# Patient Record
Sex: Male | Born: 1950 | Race: White | Hispanic: No | Marital: Single | Smoking: Never smoker
Health system: Southern US, Community
[De-identification: ages and names within clinical notes are randomized; demographics above are authoritative.]

## PROBLEM LIST (undated history)

## (undated) DIAGNOSIS — I10 Essential (primary) hypertension: Secondary | ICD-10-CM

## (undated) DIAGNOSIS — K219 Gastro-esophageal reflux disease without esophagitis: Secondary | ICD-10-CM

## (undated) DIAGNOSIS — A809 Acute poliomyelitis, unspecified: Secondary | ICD-10-CM

## (undated) HISTORY — PX: CHOLECYSTECTOMY: SHX55

---

## 2008-08-11 ENCOUNTER — Encounter
Admission: RE | Admit: 2008-08-11 | Discharge: 2008-09-15 | Payer: Self-pay | Admitting: Physical Medicine and Rehabilitation

## 2008-08-12 ENCOUNTER — Ambulatory Visit: Payer: Self-pay | Admitting: Physical Medicine and Rehabilitation

## 2008-09-09 ENCOUNTER — Ambulatory Visit: Payer: Self-pay | Admitting: Physical Medicine and Rehabilitation

## 2010-12-06 NOTE — Assessment & Plan Note (Signed)
[name] is a very pleasant 60 year old gentleman who has a  history of polio back in 1959.  He is a patient of Dr. Margo Common.   Christopher Aguilar was last seen August 12, 2008.  He was referred for  evaluation for sources of the left leg.   Christopher Aguilar has no history of any kind of pain or left knee problems.   He does have a history of 8-10-year slowly progressive weakening of his  quadriceps and hamstrings.   Christopher Aguilar continues to be a very high functioning gentleman.  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   His function is very high.  He states that the leg does not give way on  him not more than 1 time every few months.  It is not really limiting  him at all in his functional activities or his ambulation at this time.   At the last visit, there was consideration given to getting him an  orthosis to minimize left knee hyperextension during heel strike.  He  discussed this further with orthotist to provide him with the type of  orthosis which would require custom fit was 650 dollars.   Christopher Aguilar does not feel that he can justify spending that much money  on a brace at this time.  He understands that the brace is meant to help  prevent further stretching of the joint capsule over time; however, his  function is so good that he has difficulty justifying pain for a brace  that he is not sure he would use most of the time.   There have been no other changes in the past medical, social, or family  history since the last visit.   PHYSICAL EXAMINATION:  His blood pressure is 156/79, pulse 77,  respirations 18, 97% saturated on room air.  He is a well-developed,  well-nourished gentleman who does not appear in any distress.  He is  oriented x3.  Speech is clear.  Affect is bright.  He is alert,  cooperative, and pleasant.  Follows  commands without difficulty.  Cranial nerves are grossly intact.  Coordination is intact.  Reflexes  are diminished in the lower extremities.  Sensation is intact in the  lower extremities.   Motor strength in the left quadriceps is 1-2/5.  Left hamstrings 1-2/5,  hip flexors are 4+ to 5/5 on the left and 4+ to 5/5 at ankle  dorsiflexion and plantar flexion.  The right lower extremity is 5/5  throughout the right lower extremity.  He has very good muscle bulk in  the right lower extremity.  In the left lower extremity, he has  significant atrophy of the left quadriceps and hamstrings.   He does not appear to have any medial or lateral laxity at the left knee  joint.  He has a couple millimeters of laxity with anterior and  posterior translation with Lachman maneuver on the left.   Bilateral pes cava deformity noted in both feet.  Limb shortening in the  left lower extremity to 3 cm.   The radiographs of the lumbar spine was obtained; however, the hospital  has not sent [place] a copy of that report as of yet.  We will need to have  staff tracked the x-ray report down.   IMPRESSION:  1. History of  polio with residual left leg weakness.  2. History of very slowly progressive weakness in the left lower      extremity over 8-10 years consistent with post polio type syndrome.  3. 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  4. Bilateral pes cavus.  5. Lumbar scoliosis.  6. Gait abnormalities with knee hyperextension at heel strike      secondary to quadriceps weakness.   PLAN:  A long discussion regarding treatment options for Christopher Aguilar  today.  The custom brace which he has considered is approximately 650  dollars.  He is not sure he would wear the brace that much at this point  and would like to hold off prior to considering purchase.  We would like  him to follow up on a p.r.n. basis in our clinic when our orthotist and  physical therapist are present for further evaluation  when he feels he  is ready to consider orthotic management of his left knee weakness.  We  will see him back on a p.r.n. basis.  We will obtain the results of the  lumbar radiographs and reviewed that with him once the results are  known.           ______________________________  Brantley Stage, M.D.     DMK/MedQ  D:  09/09/2008 14:08:18  T:  09/10/2008 01:33:58  Job #:  [id number]   cc:   Wyvonnia Lora  Fax: [phone]

## 2010-12-06 NOTE — Group Therapy Note (Signed)
[name] is a 60 year old gentleman who has a history of polio  onset in 59.  He is kindly referred by Dr. Margo Common.   Christopher Aguilar states that when he was approximately 39 or 60 years old in  1958, he contracted polio and he was subsequently hospitalized and  underwent some physical therapy and some hydrotherapy for strengthening  his left lower limb.   Christopher Aguilar states that he has had left limb weakness all his life.  However, he has noted over the last several years an increase in the  weakness in this left lower extremity.   He states over the last several years, he has noted that the left leg  seems to get more tired.  He has difficulty now walking up hills.  He  has noted that when he is standing and doing work, if he accidentally  puts more weight through the left leg, he may fall.   For the most part, this has been very slowly expressive over several  years' time.   He reports he does have some occasional low back achiness.  He denies  any type of radicular pain, pain shooting down the leg.  He has no  particular joint pain in the hip, or the knee, or the ankle.   Denies any numbness, tingling, or tremors.   He reports overall he is an extremely active gentleman.  He is able to  be up walking all day.  He can climb stairs.  He does have some  difficulty climbing with the left leg.  He may have to pull himself up  at sometimes on occasion.  He is retired as a Cytogeneticist.  However, he is taking part-time job with [employer]ing [employer].   He is independent with all of his self-care and independent, and does  not use any assistive devices for ambulation at this time.  He does have  a knee sleeve that he occasionally wears, but he does not use that  frequently.   REVIEW OF SYSTEMS:  Essentially negative.   Physicians involved in his chronic care include Dr. Margo Common.   PAST MEDICAL HISTORY:  Remarkable for hypertension only.  He denies past  surgical  interventions.   He is married.  He does not smoke or drink alcohol.   FAMILY HISTORY:  Positive for heart disease, diabetes, and high blood  pressure.   MEDICATIONS:  Lisinopril and Paxil.   He also states he occasionally takes Aleve for his low back pain, not  more than 1 or 2 tablets, a couple of times a week at the most.   PHYSICAL EXAMINATION:  VITAL SIGNS:  His blood pressure is 146/89, pulse  74, respirations 18, and 99% saturated on room air.  GENERAL:  He is a well-developed and well-nourished gentleman who does  not appear in any distress.  NEUROLOGIC:  He is oriented x3.  Speech is clear.  His affect is bright.  He is alert, cooperative, and pleasant.  He follows commands without  difficulty and answers all questions appropriately.   His cranial nerves are grossly intact.  Coordination is intact.  Sensation to light touch and pinprick is intact in bilateral lower and  upper extremities.   EXTREMITIES:  Motor strength is normal in the upper extremities and in  the lower extremities, he has noted to have 5/5 strength at right hip  flexors, and extensors knee, 5/5 at right knee flexors and extensors,  and 5/5 right dorsiflexors may have 4+/5  strength right plantar flexors.  Left lower extremity reveals 4+/5 hip flexors and extensors, 2/5 knee  extensors, 2/5 knee flexors, dorsiflexors on the left are 4+/5 and  plantar flexors are 4-/5 on the left.   Reflexes are symmetric in the upper extremities.  Diminished patellar  and Achilles tendons in lower extremities.  No abnormal tone is noted.  No clonus is noted in the right lower extremity and left lower extremity  decreased tone.  Significant atrophy is noted in both the quadriceps as  well as the hamstring.   He has bilateral pes cavus deformity in both feet.   He has a shortened left lower limb approximately 2-3 cm.   Scoliosis is noted while he is standing.  It appears to be flexible.   His gait is remarkable for  a gait abnormality and that during heel  strike on the left, he has some hyperextension at the left knee,  compensating for weak quadriceps muscles.   IMPRESSION:  1. History of polio with residual left leg weakness.  2. History of very slowly progressive weakness in the left lower      extremity (over several years) consistent with post-polio type      syndrome.  The patient does have some occasional back pain without      any type of radicular symptoms, however.  3. Shortened left lower limb approximately 2-3 cm.  4. Bilateral pes cavus.  5. Lumbar scoliosis, secondary to shortened left lower limb      approximately 2-3 cm.  6. Gait abnormalities with knee hyperextension and heel strike      secondary to quadriceps weakness.   PLAN:  Long discussion regarding options for Christopher Aguilar at this point  as well as discussion of post-polio syndrome with him.  We discussed  risks and benefits of exercise at this point.  I also discussed orthotic  options for his weak left leg.  The patient would like to consider a  knee orthosis at this time to prevent further knee joint deformity in  that he started to get some hyperextension of the knee at the heel  strike, which may put more stress through his left knee overtime.   Although, the left leg instability is an issue for him.  He is at this  point not interested in considering KAFO which may provide more  stability for him.  At this point, we will write a prescription for left  knee orthosis with knee extension stop.  I anticipate overtime, he will  need modification of this orthosis, however.  He understands this  aspect.   I will see him back in a month.   I did call orthotist over at [facility] and  discuss this case and may also consider having him followup in our  [facility] which meets once a month.           ______________________________  Brantley Stage, M.D.     DMK/MedQ  D:   08/14/2008 08:31:17  T:  08/14/2008 20:43:02  Job #:  [id number]   cc:   Wyvonnia Lora  Fax: [phone]

## 2015-03-25 HISTORY — PX: GALLBLADDER SURGERY: SHX652

## 2016-08-11 ENCOUNTER — Emergency Department (HOSPITAL_COMMUNITY): Payer: Worker's Compensation

## 2016-08-11 ENCOUNTER — Encounter (HOSPITAL_COMMUNITY): Payer: Self-pay | Admitting: Emergency Medicine

## 2016-08-11 ENCOUNTER — Emergency Department (HOSPITAL_COMMUNITY)
Admission: EM | Admit: 2016-08-11 | Discharge: 2016-08-11 | Disposition: A | Discharge status: Home / Self Care | Payer: Worker's Compensation | Attending: Emergency Medicine | Admitting: Emergency Medicine

## 2016-08-11 DIAGNOSIS — Y929 Unspecified place or not applicable: Secondary | ICD-10-CM | POA: Diagnosis not present

## 2016-08-11 DIAGNOSIS — Y939 Activity, unspecified: Secondary | ICD-10-CM | POA: Diagnosis not present

## 2016-08-11 DIAGNOSIS — Z791 Long term (current) use of non-steroidal anti-inflammatories (NSAID): Secondary | ICD-10-CM | POA: Insufficient documentation

## 2016-08-11 DIAGNOSIS — Z79899 Other long term (current) drug therapy: Secondary | ICD-10-CM | POA: Insufficient documentation

## 2016-08-11 DIAGNOSIS — W009XXA Unspecified fall due to ice and snow, initial encounter: Secondary | ICD-10-CM | POA: Diagnosis not present

## 2016-08-11 DIAGNOSIS — S52532A Colles' fracture of left radius, initial encounter for closed fracture: Secondary | ICD-10-CM | POA: Diagnosis not present

## 2016-08-11 DIAGNOSIS — Y999 Unspecified external cause status: Secondary | ICD-10-CM | POA: Diagnosis not present

## 2016-08-11 DIAGNOSIS — I10 Essential (primary) hypertension: Secondary | ICD-10-CM | POA: Insufficient documentation

## 2016-08-11 DIAGNOSIS — R52 Pain, unspecified: Secondary | ICD-10-CM

## 2016-08-11 DIAGNOSIS — S6992XA Unspecified injury of left wrist, hand and finger(s), initial encounter: Secondary | ICD-10-CM | POA: Diagnosis present

## 2016-08-11 HISTORY — DX: Essential (primary) hypertension: I10

## 2016-08-11 MED ORDER — ETOMIDATE 2 MG/ML IV SOLN
10.0000 mg | Freq: Once | INTRAVENOUS | Status: AC
  Administered 2016-08-11: 10 mg via INTRAVENOUS
  Filled 2016-08-11: qty 10

## 2016-08-11 MED ORDER — IBUPROFEN 600 MG PO TABS: 600.0000 mg | ORAL_TABLET | Freq: Four times a day (QID) | ORAL | 0 refills | Status: DC | PRN

## 2016-08-11 MED ORDER — ONDANSETRON HCL 4 MG/2ML IJ SOLN
4.0000 mg | Freq: Once | INTRAMUSCULAR | Status: AC
  Administered 2016-08-11: 4 mg via INTRAVENOUS
  Filled 2016-08-11: qty 2

## 2016-08-11 MED ORDER — MORPHINE SULFATE (PF) 4 MG/ML IV SOLN
4.0000 mg | Freq: Once | INTRAVENOUS | Status: AC
  Administered 2016-08-11: 4 mg via INTRAVENOUS
  Filled 2016-08-11: qty 1

## 2016-08-11 MED ORDER — MIDAZOLAM HCL 2 MG/2ML IJ SOLN
2.0000 mg | Freq: Once | INTRAMUSCULAR | Status: AC
  Administered 2016-08-11: 1 mg via INTRAVENOUS
  Filled 2016-08-11: qty 2

## 2016-08-11 MED ORDER — OXYCODONE-ACETAMINOPHEN 5-325 MG PO TABS
1.0000 | ORAL_TABLET | ORAL | 0 refills | Status: DC | PRN
Start: 2016-08-11 — End: 2016-08-17

## 2016-08-11 NOTE — ED Provider Notes (Addendum)
[facility]-EMER[facility]ENCY DEPT Provider Note   CSN: [id number] Arrival date & time: 08/11/16  1242     History   Chief Complaint Chief Complaint  Patient presents with  . Wrist Injury    HPI Christopher Aguilar is a 66 y.o. male.  Pt presents to the ED today with a fall on ice with left wrist pain and deformity.  The pt denies any other injury.      Past Medical History:  Diagnosis Date  . Hypertension     There are no active problems to display for this patient.   History reviewed. No pertinent surgical history.     Home Medications    Prior to Admission medications   Medication Sig Start Date End Date Taking? Authorizing Provider  Ascorbic Acid (VITAMIN C) 1000 MG tablet Take 1,000 mg by mouth daily.   Yes Historical Provider, MD  docusate sodium (COLACE) 100 MG capsule Take 100 mg by mouth 2 (two) times daily.   Yes Historical Provider, MD  lisinopril (PRINIVIL,ZESTRIL) 20 MG tablet Take 20 mg by mouth daily.   Yes Historical Provider, MD  Multiple Vitamin (MULTIVITAMIN WITH MINERALS) TABS tablet Take 1 tablet by mouth daily.   Yes Historical Provider, MD  naproxen sodium (ANAPROX) 220 MG tablet Take 220 mg by mouth 2 (two) times daily with a meal.   Yes Historical Provider, MD  omeprazole (PRILOSEC) 20 MG capsule Take 20 mg by mouth every morning.   Yes Historical Provider, MD  Probiotic Product (PHILLIPS COLON HEALTH PO) Take 1 capsule by mouth daily.   Yes Historical Provider, MD  ranitidine (ZANTAC) 150 MG tablet Take 150 mg by mouth at bedtime.   Yes Historical Provider, MD  ibuprofen (ADVIL,MOTRIN) 600 MG tablet Take 1 tablet (600 mg total) by mouth every 6 (six) hours as needed. 08/11/16   Jacalyn Lefevre, MD  oxyCODONE-acetaminophen (PERCOCET/ROXICET) 5-325 MG tablet Take 1-2 tablets by mouth every 4 (four) hours as needed for severe pain. 08/11/16   Jacalyn Lefevre, MD    Family History No family history on file.  Social History Social History  Substance Use Topics    . Smoking status: Never Smoker  . Smokeless tobacco: Never Used  . Alcohol use No     Allergies   Penicillins   Review of Systems Review of Systems  Musculoskeletal:       Left wrist  All other systems reviewed and are negative.    Physical Exam Updated Vital Signs BP 137/78   Pulse 65   Temp 98.5 F (36.9 C) (Oral)   Resp 15   Ht 5\' 9"  (1.753 m)   Wt 185 lb (83.9 kg)   SpO2 99%   BMI 27.32 kg/m   Physical Exam  Constitutional: He is oriented to person, place, and time. He appears well-developed and well-nourished.  HENT:  Head: Normocephalic and atraumatic.  Right Ear: External ear normal.  Left Ear: External ear normal.  Nose: Nose normal.  Mouth/Throat: Oropharynx is clear and moist.  Eyes: Conjunctivae and EOM are normal. Pupils are equal, round, and reactive to light.  Neck: Normal range of motion. Neck supple.  Cardiovascular: Normal rate, regular rhythm, normal heart sounds and intact distal pulses.   Pulmonary/Chest: Effort normal and breath sounds normal.  Abdominal: Soft. Bowel sounds are normal.  Musculoskeletal:       Left wrist: He exhibits decreased range of motion, bony tenderness, swelling and deformity.  Neurological: He is alert and oriented to person, place, and time.  Skin: Skin is warm.  Psychiatric: He has a normal mood and affect. His behavior is normal. Judgment and thought content normal.  Nursing note and vitals reviewed.    ED Treatments / Results  Labs (all labs ordered are listed, but only abnormal results are displayed) Labs Reviewed - No data to display  EKG  EKG Interpretation None       Radiology Dg Wrist Complete Left  Result Date: 08/11/2016 CLINICAL DATA:  Pain following fall EXAM: LEFT WRIST - COMPLETE 3+ VIEW COMPARISON:  None. FINDINGS: Frontal, oblique, and lateral views were obtained. There is a comminuted fracture of the distal radial metaphysis with dorsal angulation distally. There is impaction at the  fracture site. Fracture fragments extend into the radiocarpal joint. There is avulsion of the ulnar styloid. No other fractures are evident. No dislocation. Joint spaces appear unremarkable. IMPRESSION: Comminuted fracture distal radial metaphysis with impaction and dorsal angulation distally. Note that fracture fragments in this area extend into the radiocarpal joint. There is avulsion the ulnar styloid. No dislocation. No evident arthropathic change. Electronically Signed   By: Bretta BangWilliam  Woodruff III M.D.   On: 08/11/2016 13:24    Procedures .Sedation Date/Time: 08/11/2016 3:34 PM Performed by: Jacalyn LefevreHAVILAND, Litha Lamartina Authorized by: Jacalyn LefevreHAVILAND, Keion Neels   Consent:    Consent obtained:  Written   Consent given by:  Patient   Risks discussed:  Allergic reaction and inadequate sedation   Alternatives discussed:  Analgesia without sedation Indications:    Procedure performed:  Fracture reduction   Procedure necessitating sedation performed by:  Physician performing sedation   Intended level of sedation:  Moderate (conscious sedation) Pre-sedation assessment:    Time since last food or drink:  Hours   ASA classification: class 1 - normal, healthy patient     Neck mobility: normal     Mouth opening:  3 or more finger widths   Pre-sedation assessments completed and reviewed: airway patency, cardiovascular function, hydration status, mental status, nausea/vomiting, pain level, respiratory function and temperature     History of difficult intubation: no   Immediate pre-procedure details:    Reassessment: Patient reassessed immediately prior to procedure     Reviewed: vital signs and NPO status     Verified: bag valve mask available, emergency equipment available, intubation equipment available, IV patency confirmed, oxygen available, reversal medications available and suction available   Procedure details (see MAR for exact dosages):    Preoxygenation:  Room air   Sedation:  Midazolam   Analgesia:   Morphine   Intra-procedure monitoring:  Blood pressure monitoring, cardiac monitor, continuous capnometry, continuous pulse oximetry, frequent LOC assessments and frequent vital sign checks   Intra-procedure events: none   Post-procedure details:    Attendance: Constant attendance by certified staff until patient recovered     Recovery: Patient returned to pre-procedure baseline     Estimated blood loss (see I/O flowsheets): no     Post-sedation assessments completed and reviewed: airway patency, cardiovascular function, hydration status, mental status, nausea/vomiting, pain level, respiratory function and temperature     Specimens recovered:  None   Patient is stable for discharge or admission: yes     Patient tolerance:  Tolerated well, no immediate complications Comments:     Please see nurse's note for times. Reduction of fracture Date/Time: 08/11/2016 3:36 PM Performed by: Jacalyn LefevreHAVILAND, Climmie Buelow Authorized by: Jacalyn LefevreHAVILAND, Justine Cossin  Consent: Written consent obtained. Risks and benefits: risks, benefits and alternatives were discussed Consent given by: patient Patient understanding: patient states understanding of  the procedure being performed Patient consent: the patient's understanding of the procedure matches consent given Procedure consent: procedure consent matches procedure scheduled Relevant documents: relevant documents present and verified Test results: test results available and properly labeled Site marked: the operative site was marked Imaging studies: imaging studies available Patient identity confirmed: verbally with patient Time out: Immediately prior to procedure a "time out" was called to verify the correct patient, procedure, equipment, support staff and site/side marked as required. Preparation: Patient was prepped and draped in the usual sterile fashion. Local anesthesia used: no  Anesthesia: Local anesthesia used: no  Sedation: Patient sedated: yes Sedation type:  moderate (conscious) sedation Sedatives: midazolam and etomidate Analgesia: morphine Vitals: Vital signs were monitored during sedation. Patient tolerance: Patient tolerated the procedure well with no immediate complications Comments: Please see nurse's notes for times.  Marland KitchenSplint Application Date/Time: 08/11/2016 3:37 PM Performed by: Jacalyn Lefevre Authorized by: Jacalyn Lefevre   Consent:    Consent obtained:  Written   Consent given by:  Patient   Risks discussed:  Discoloration, numbness, pain and swelling   Alternatives discussed:  No treatment Pre-procedure details:    Sensation:  Normal Procedure details:    Laterality:  Left   Location:  Wrist   Wrist:  L wrist   Strapping: no     Splint type:  Sugar tong   Supplies:  Elastic bandage, cotton padding, Ortho-Glass and sling Post-procedure details:    Pain:  Improved   Sensation:  Normal   Skin color:  Normal   Patient tolerance of procedure:  Tolerated well, no immediate complications   (including critical care time)  Medications Ordered in ED Medications  morphine 4 MG/ML injection 4 mg (4 mg Intravenous Given 08/11/16 1403)  ondansetron (ZOFRAN) injection 4 mg (4 mg Intravenous Given 08/11/16 1403)  etomidate (AMIDATE) injection 10 mg (10 mg Intravenous Given 08/11/16 1511)  midazolam (VERSED) injection 2 mg (1 mg Intravenous Given 08/11/16 1510)     Initial Impression / Assessment and Plan / ED Course  I have reviewed the triage vital signs and the nursing notes.  Pertinent labs & imaging results that were available during my care of the patient were reviewed by me and considered in my medical decision making (see chart for details).  Pt d/w Dr. Romeo Apple who said for pt to call on Monday the 22nd for an appt.  He said pt will likely need surgery.  Pt is awake and alert after sedation and is stable for d/c.   Please see nurse's notes for time of sedation.  Final Clinical Impressions(s) / ED Diagnoses    Final diagnoses:  Pain  Closed Colles' fracture of left radius, initial encounter    New Prescriptions New Prescriptions   IBUPROFEN (ADVIL,MOTRIN) 600 MG TABLET    Take 1 tablet (600 mg total) by mouth every 6 (six) hours as needed.   OXYCODONE-ACETAMINOPHEN (PERCOCET/ROXICET) 5-325 MG TABLET    Take 1-2 tablets by mouth every 4 (four) hours as needed for severe pain.     Jacalyn Lefevre, MD 08/11/16 1600    Jacalyn Lefevre, MD 08/30/16 [phone]

## 2016-08-11 NOTE — ED Triage Notes (Signed)
Patient states he fell on ice today and caught himself with is left hand. Left wrist deformity. Cap refill and pulse intact.

## 2016-08-14 ENCOUNTER — Ambulatory Visit (INDEPENDENT_AMBULATORY_CARE_PROVIDER_SITE_OTHER): Payer: Worker's Compensation | Admitting: Orthopedic Surgery

## 2016-08-14 ENCOUNTER — Telehealth: Payer: Self-pay | Admitting: Orthopedic Surgery

## 2016-08-14 ENCOUNTER — Encounter: Payer: Self-pay | Admitting: Orthopedic Surgery

## 2016-08-14 VITALS — BP 130/80 | HR 75 | Wt 184.0 lb

## 2016-08-14 DIAGNOSIS — S52532A Colles' fracture of left radius, initial encounter for closed fracture: Secondary | ICD-10-CM | POA: Diagnosis not present

## 2016-08-14 NOTE — Telephone Encounter (Signed)
Patient seen at our clinic today, [facility], 08/14/16, per our provider, Dr. Romeo AppleHarrison, being on call at [facility] re: Work-related injury 08/11/16.  Employer [employer] workers comp insurer info to follow, per owner and per supervisor(forms faxed 08/14/16, to attention: Felicity Pellegrinied, ph# [phone]/Fax [phone]

## 2016-08-14 NOTE — Progress Notes (Signed)
Patient ID: Christopher Aguilar, male   DOB: [birth year], 66 y.o.   MRN: [id number]  Left wrist pain 3 days  HPI Christopher Aguilar is a 66 y.o. male.  Presents with pain over the left wrist moderate to severe for 3 days dull sharp aching pain with deformity of the wrist secondary to mechanical fall pain is constant  He had closed reduction in the emergency room by the ER doctor  Review of Systems Review of Systems No chest pain or shortness of breath  Past Medical History:  Diagnosis Date  . Hypertension     No past surgical history on file.  Social History Social History  Substance Use Topics  . Smoking status: Never Smoker  . Smokeless tobacco: Never Used  . Alcohol use No    Allergies  Allergen Reactions  . Penicillins Hives    Has patient had a PCN reaction causing immediate rash, facial/tongue/throat swelling, SOB or lightheadedness with hypotension: Yes Has patient had a PCN reaction causing severe rash involving mucus membranes or skin necrosis: No Has patient had a PCN reaction that required hospitalization No Has patient had a PCN reaction occurring within the last 10 years: No If all of the above answers are "NO", then may proceed with Cephalosporin use.     No outpatient prescriptions have been marked as taking for the 08/14/16 encounter (Appointment) with Vickki HearingStanley E Jamariah Tony, MD.      Physical Exam Physical Exam There were no vitals taken for this visit.  Gen. appearance. The patient is well-developed and well-nourished, grooming and hygiene are normal. There are no gross congenital abnormalities  The patient is alert and oriented to person place and time  Mood and affect are normal  Ambulation Is normal Examination reveals the following: On inspection we find left hand and wrist in a sugar tong splint. Fingers are visible and show good capillary refill normal motion normal sensation.  Left elbow normal range of motion no instability shoulder nontender  clavicle nontender. Left lower extremity atrophy from prior polio still remains with good flexion extension  Right leg size normal flexion-extension normal  Skin we find no rash ulceration or erythema both upper extremities  Sensation remains intact both upper extremities  Vascular exam normal capillary refill and color to both upper extremities both upper extremities  Data Reviewed Plain films show large radial styloid component comminuted intra-articular fracture loss of normal volar tilt  Assessment    Closed left distal radius intra-articular comminuted fracture    Plan    We will make a referral to a hand specialist for treatment       Fuller CanadaStanley Costantino Kohlbeck 08/14/2016, 10:04 AM

## 2016-08-15 NOTE — Telephone Encounter (Signed)
CLAIM #[id number], received per supervisor Nicola Girted Hopkins, at patient's employer [employer] and workers comp insurer, [employer].

## 2016-08-17 ENCOUNTER — Encounter (HOSPITAL_BASED_OUTPATIENT_CLINIC_OR_DEPARTMENT_OTHER): Payer: Self-pay | Admitting: *Deleted

## 2016-08-17 ENCOUNTER — Encounter (INDEPENDENT_AMBULATORY_CARE_PROVIDER_SITE_OTHER): Payer: Self-pay | Admitting: Orthopaedic Surgery

## 2016-08-17 ENCOUNTER — Ambulatory Visit (INDEPENDENT_AMBULATORY_CARE_PROVIDER_SITE_OTHER): Payer: Worker's Compensation | Admitting: Orthopaedic Surgery

## 2016-08-17 ENCOUNTER — Other Ambulatory Visit (INDEPENDENT_AMBULATORY_CARE_PROVIDER_SITE_OTHER): Payer: Self-pay | Admitting: Orthopaedic Surgery

## 2016-08-17 DIAGNOSIS — S52502A Unspecified fracture of the lower end of left radius, initial encounter for closed fracture: Secondary | ICD-10-CM | POA: Diagnosis not present

## 2016-08-17 DIAGNOSIS — S52602A Unspecified fracture of lower end of left ulna, initial encounter for closed fracture: Secondary | ICD-10-CM

## 2016-08-17 MED ORDER — TRAMADOL HCL 50 MG PO TABS: 50.0000 mg | ORAL_TABLET | Freq: Four times a day (QID) | ORAL | 0 refills | Status: DC | PRN

## 2016-08-17 NOTE — Progress Notes (Signed)
   Office Visit Note   Patient: Christopher Aguilar           Date of Birth: 10-09-1950           MRN: [id number] Visit Date: 08/17/2016              Requested by: Oley Balmavid B. Margo Commonapper, MD [address] [place], [place] [id number] PCP: Louie BostonAPPER,Corey B, MD   Assessment & Plan: Visit Diagnoses:  1. Closed fracture of left distal radius and ulna, initial encounter     Plan: X-rays reviewed shows comminuted displaced intra-articular distal radius fracture. Recommend operative fixation and discussed risks benefits alternatives to surgery. Tramadol as prescribed. We'll plan for surgery next week.  Follow-Up Instructions: Return for 2 week postop visit.   Orders:  No orders of the defined types were placed in this encounter.  Meds ordered this encounter  Medications  . traMADol (ULTRAM) 50 MG tablet    Sig: Take 1 tablet (50 mg total) by mouth every 6 (six) hours as needed.    Dispense:  30 tablet    Refill:  0      Procedures: No procedures performed   Clinical Data: No additional findings.   Subjective: Chief Complaint  Patient presents with  . Left Wrist - Injury    Patient is 66 year old gentleman who is right-hand dominant who sustained a left distal radius fracture on 08/11/2016 and was referred here by Dr. Fuller CanadaStanley Harrison. He denies significant pain. He does have bruising tingling burning and swelling. Pain does not radiate. Pain is better with immobilization and elevation.    Review of Systems Complete review of systems negative except for history of present illness  Objective: Vital Signs: There were no vitals taken for this visit.  Physical Exam  Constitutional: He is oriented to person, place, and time. He appears well-developed and well-nourished.  HENT:  Head: Normocephalic and atraumatic.  Eyes: Pupils are equal, round, and reactive to light.  Neck: Neck supple.  Pulmonary/Chest: Effort normal.  Abdominal: Soft.  Musculoskeletal: Normal range of motion.    Neurological: He is alert and oriented to person, place, and time.  Skin: Skin is warm.  Psychiatric: He has a normal mood and affect. His behavior is normal. Judgment and thought content normal.  Nursing note and vitals reviewed.   Ortho Exam Exam of the left wrist shows mild swelling and bruising. He has no median nerve symptoms. Hand is warm well-perfused. Specialty Comments:  No specialty comments available.  Imaging: No results found.   PMFS History: Patient Active Problem List   Diagnosis Date Noted  . Closed fracture of left distal radius and ulna, initial encounter 08/17/2016   Past Medical History:  Diagnosis Date  . GERD (gastroesophageal reflux disease)   . Hypertension   . Polio    as a child, has a very mild limp.    Family History  Problem Relation Age of Onset  . Diabetes Mother   . Hypertension Mother   . Cancer Sister   . Diabetes Brother   . Hypertension Brother     Past Surgical History:  Procedure Laterality Date  . GALLBLADDER SURGERY  03/2015   Social History   Occupational History  . Not on file.   Social History Main Topics  . Smoking status: Never Smoker  . Smokeless tobacco: Never Used  . Alcohol use No  . Drug use: No  . Sexual activity: Not on file

## 2016-08-21 ENCOUNTER — Telehealth (INDEPENDENT_AMBULATORY_CARE_PROVIDER_SITE_OTHER): Payer: Self-pay | Admitting: Orthopaedic Surgery

## 2016-08-21 ENCOUNTER — Other Ambulatory Visit: Payer: Self-pay

## 2016-08-21 ENCOUNTER — Encounter (HOSPITAL_COMMUNITY)
Admission: RE | Admit: 2016-08-21 | Discharge: 2016-08-21 | Disposition: A | Discharge status: Home / Self Care | Payer: Worker's Compensation | Source: Ambulatory Visit | Attending: Orthopaedic Surgery | Admitting: Orthopaedic Surgery

## 2016-08-21 DIAGNOSIS — Z01810 Encounter for preprocedural cardiovascular examination: Secondary | ICD-10-CM | POA: Insufficient documentation

## 2016-08-21 DIAGNOSIS — I10 Essential (primary) hypertension: Secondary | ICD-10-CM | POA: Insufficient documentation

## 2016-08-21 NOTE — Telephone Encounter (Signed)
Notes from initial visit faxed (through HIM/Release of Information) internally, to workers comp insurer: [employer] [address], [place], [place] [id number] / Ph [phone]/Fax [phone]

## 2016-08-21 NOTE — Telephone Encounter (Signed)
LMOM  Rx was called in today into his pharm. Should be ready for pick up at the pharm in a few

## 2016-08-21 NOTE — Telephone Encounter (Signed)
Patient calling to request Tramadol for post surgery on L wrist.  He thought that it was to be called in after his last Thursday appt with Roda ShuttersXu, but [facility] yesterday afternoon and it's not there yet.

## 2016-08-23 ENCOUNTER — Encounter (HOSPITAL_BASED_OUTPATIENT_CLINIC_OR_DEPARTMENT_OTHER): Payer: Self-pay | Admitting: Certified Registered"

## 2016-08-23 ENCOUNTER — Ambulatory Visit (HOSPITAL_BASED_OUTPATIENT_CLINIC_OR_DEPARTMENT_OTHER)
Admission: RE | Admit: 2016-08-23 | Discharge: 2016-08-23 | Disposition: A | Discharge status: Home / Self Care | Payer: Worker's Compensation | Source: Ambulatory Visit | Attending: Orthopaedic Surgery | Admitting: Orthopaedic Surgery

## 2016-08-23 ENCOUNTER — Ambulatory Visit (HOSPITAL_BASED_OUTPATIENT_CLINIC_OR_DEPARTMENT_OTHER): Payer: Worker's Compensation | Admitting: Anesthesiology

## 2016-08-23 ENCOUNTER — Encounter (HOSPITAL_BASED_OUTPATIENT_CLINIC_OR_DEPARTMENT_OTHER)
Admission: RE | Disposition: A | Discharge status: Home / Self Care | Payer: Self-pay | Source: Ambulatory Visit | Attending: Orthopaedic Surgery

## 2016-08-23 DIAGNOSIS — Z791 Long term (current) use of non-steroidal anti-inflammatories (NSAID): Secondary | ICD-10-CM | POA: Diagnosis not present

## 2016-08-23 DIAGNOSIS — X58XXXA Exposure to other specified factors, initial encounter: Secondary | ICD-10-CM | POA: Insufficient documentation

## 2016-08-23 DIAGNOSIS — Z8249 Family history of ischemic heart disease and other diseases of the circulatory system: Secondary | ICD-10-CM | POA: Diagnosis not present

## 2016-08-23 DIAGNOSIS — Z880 Allergy status to penicillin: Secondary | ICD-10-CM | POA: Insufficient documentation

## 2016-08-23 DIAGNOSIS — S52502A Unspecified fracture of the lower end of left radius, initial encounter for closed fracture: Secondary | ICD-10-CM | POA: Diagnosis not present

## 2016-08-23 DIAGNOSIS — I4510 Unspecified right bundle-branch block: Secondary | ICD-10-CM | POA: Diagnosis not present

## 2016-08-23 DIAGNOSIS — Z8612 Personal history of poliomyelitis: Secondary | ICD-10-CM | POA: Diagnosis not present

## 2016-08-23 DIAGNOSIS — Z833 Family history of diabetes mellitus: Secondary | ICD-10-CM | POA: Insufficient documentation

## 2016-08-23 DIAGNOSIS — I10 Essential (primary) hypertension: Secondary | ICD-10-CM | POA: Insufficient documentation

## 2016-08-23 DIAGNOSIS — S52572A Other intraarticular fracture of lower end of left radius, initial encounter for closed fracture: Secondary | ICD-10-CM | POA: Diagnosis present

## 2016-08-23 DIAGNOSIS — K219 Gastro-esophageal reflux disease without esophagitis: Secondary | ICD-10-CM | POA: Insufficient documentation

## 2016-08-23 DIAGNOSIS — Z79899 Other long term (current) drug therapy: Secondary | ICD-10-CM | POA: Diagnosis not present

## 2016-08-23 HISTORY — PX: OPEN REDUCTION INTERNAL FIXATION (ORIF) DISTAL RADIAL FRACTURE: SHX5989

## 2016-08-23 HISTORY — DX: Acute poliomyelitis, unspecified: A80.9

## 2016-08-23 HISTORY — DX: Gastro-esophageal reflux disease without esophagitis: K21.9

## 2016-08-23 SURGERY — OPEN REDUCTION INTERNAL FIXATION (ORIF) DISTAL RADIUS FRACTURE
Anesthesia: Monitor Anesthesia Care | Site: Wrist | Laterality: Left

## 2016-08-23 MED ORDER — MIDAZOLAM HCL 2 MG/2ML IJ SOLN
1.0000 mg | INTRAMUSCULAR | Status: DC | PRN
  Administered 2016-08-23: 1 mg via INTRAVENOUS

## 2016-08-23 MED ORDER — DEXAMETHASONE SODIUM PHOSPHATE 10 MG/ML IJ SOLN
INTRAMUSCULAR | Status: AC
  Filled 2016-08-23: qty 1

## 2016-08-23 MED ORDER — ONDANSETRON HCL 4 MG PO TABS: 4.0000 mg | ORAL_TABLET | Freq: Three times a day (TID) | ORAL | 0 refills | Status: DC | PRN

## 2016-08-23 MED ORDER — OXYCODONE HCL ER 10 MG PO T12A: 10.0000 mg | EXTENDED_RELEASE_TABLET | Freq: Two times a day (BID) | ORAL | 0 refills | Status: DC

## 2016-08-23 MED ORDER — BUPIVACAINE-EPINEPHRINE (PF) 0.5% -1:200000 IJ SOLN
INTRAMUSCULAR | Status: DC | PRN
  Administered 2016-08-23: 30 mL via PERINEURAL

## 2016-08-23 MED ORDER — MEPIVACAINE HCL 1.5 % IJ SOLN
INTRAMUSCULAR | Status: DC | PRN
Start: 2016-08-23 — End: 2016-08-23
  Administered 2016-08-23: 10 mL via PERINEURAL

## 2016-08-23 MED ORDER — CLINDAMYCIN PHOSPHATE 900 MG/50ML IV SOLN
900.0000 mg | INTRAVENOUS | Status: AC
  Administered 2016-08-23: 900 mg via INTRAVENOUS

## 2016-08-23 MED ORDER — PROPOFOL 10 MG/ML IV BOLUS
INTRAVENOUS | Status: DC | PRN
  Administered 2016-08-23: 30 mg via INTRAVENOUS

## 2016-08-23 MED ORDER — PROPOFOL 500 MG/50ML IV EMUL
INTRAVENOUS | Status: AC
  Filled 2016-08-23: qty 50

## 2016-08-23 MED ORDER — HYDROMORPHONE HCL 1 MG/ML IJ SOLN: 0.2500 mg | INTRAMUSCULAR | Status: DC | PRN

## 2016-08-23 MED ORDER — MIDAZOLAM HCL 2 MG/2ML IJ SOLN
INTRAMUSCULAR | Status: AC
Start: 2016-08-23 — End: 2016-08-23
  Filled 2016-08-23: qty 2

## 2016-08-23 MED ORDER — FENTANYL CITRATE (PF) 100 MCG/2ML IJ SOLN
50.0000 ug | INTRAMUSCULAR | Status: DC | PRN
  Administered 2016-08-23: 100 ug via INTRAVENOUS

## 2016-08-23 MED ORDER — PROMETHAZINE HCL 25 MG PO TABS: 25.0000 mg | ORAL_TABLET | Freq: Four times a day (QID) | ORAL | 1 refills | Status: DC | PRN

## 2016-08-23 MED ORDER — OXYCODONE HCL 5 MG PO TABS: 5.0000 mg | ORAL_TABLET | Freq: Once | ORAL | Status: DC | PRN

## 2016-08-23 MED ORDER — PROPOFOL 500 MG/50ML IV EMUL
INTRAVENOUS | Status: DC | PRN
  Administered 2016-08-23: 100 ug/kg/min via INTRAVENOUS

## 2016-08-23 MED ORDER — FENTANYL CITRATE (PF) 100 MCG/2ML IJ SOLN
INTRAMUSCULAR | Status: AC
  Filled 2016-08-23: qty 2

## 2016-08-23 MED ORDER — DEXAMETHASONE SODIUM PHOSPHATE 10 MG/ML IJ SOLN
INTRAMUSCULAR | Status: DC | PRN
  Administered 2016-08-23: 4 mg via INTRAVENOUS

## 2016-08-23 MED ORDER — ONDANSETRON HCL 4 MG/2ML IJ SOLN
INTRAMUSCULAR | Status: DC | PRN
  Administered 2016-08-23: 4 mg via INTRAVENOUS

## 2016-08-23 MED ORDER — CEFAZOLIN SODIUM-DEXTROSE 2-4 GM/100ML-% IV SOLN
INTRAVENOUS | Status: AC
  Filled 2016-08-23: qty 100

## 2016-08-23 MED ORDER — LIDOCAINE 2% (20 MG/ML) 5 ML SYRINGE
INTRAMUSCULAR | Status: DC | PRN
  Administered 2016-08-23: 25 mg via INTRAVENOUS

## 2016-08-23 MED ORDER — OXYCODONE HCL 5 MG/5ML PO SOLN: 5.0000 mg | Freq: Once | ORAL | Status: DC | PRN

## 2016-08-23 MED ORDER — MIDAZOLAM HCL 2 MG/2ML IJ SOLN
INTRAMUSCULAR | Status: AC
  Filled 2016-08-23: qty 2

## 2016-08-23 MED ORDER — CLINDAMYCIN PHOSPHATE 900 MG/50ML IV SOLN
INTRAVENOUS | Status: AC
  Filled 2016-08-23: qty 50

## 2016-08-23 MED ORDER — OXYCODONE-ACETAMINOPHEN 5-325 MG PO TABS: 1.0000 | ORAL_TABLET | ORAL | 0 refills | Status: DC | PRN

## 2016-08-23 MED ORDER — VITAMIN C 500 MG PO CHEW: 500.0000 mg | CHEWABLE_TABLET | Freq: Two times a day (BID) | ORAL | 0 refills | Status: DC

## 2016-08-23 MED ORDER — ONDANSETRON HCL 4 MG/2ML IJ SOLN
INTRAMUSCULAR | Status: AC
  Filled 2016-08-23: qty 2

## 2016-08-23 MED ORDER — LACTATED RINGERS IV SOLN
INTRAVENOUS | Status: DC
  Administered 2016-08-23 (×2): via INTRAVENOUS

## 2016-08-23 MED ORDER — LIDOCAINE 2% (20 MG/ML) 5 ML SYRINGE
INTRAMUSCULAR | Status: AC
  Filled 2016-08-23: qty 5

## 2016-08-23 MED ORDER — SCOPOLAMINE 1 MG/3DAYS TD PT72: 1.0000 | MEDICATED_PATCH | Freq: Once | TRANSDERMAL | Status: DC | PRN

## 2016-08-23 MED ORDER — MEPERIDINE HCL 25 MG/ML IJ SOLN: 6.2500 mg | INTRAMUSCULAR | Status: DC | PRN

## 2016-08-23 MED ORDER — PROMETHAZINE HCL 25 MG/ML IJ SOLN: 6.2500 mg | INTRAMUSCULAR | Status: DC | PRN

## 2016-08-23 SURGICAL SUPPLY — 77 items
BANDAGE ACE 3X5.8 VEL STRL LF (GAUZE/BANDAGES/DRESSINGS) ×3 IMPLANT
BANDAGE ACE 4X5 VEL STRL LF (GAUZE/BANDAGES/DRESSINGS) IMPLANT
BIT DRILL 2.2 SS TIBIAL (BIT) ×3 IMPLANT
BLADE MINI RND TIP GREEN BEAV (BLADE) IMPLANT
BLADE SURG 15 STRL LF DISP TIS (BLADE) ×2 IMPLANT
BLADE SURG 15 STRL SS (BLADE) ×4
BNDG ESMARK 4X9 LF (GAUZE/BANDAGES/DRESSINGS) ×3 IMPLANT
BRUSH SCRUB EZ PLAIN DRY (MISCELLANEOUS) ×3 IMPLANT
CORDS BIPOLAR (ELECTRODE) ×3 IMPLANT
COVER BACK TABLE 60X90IN (DRAPES) ×3 IMPLANT
COVER MAYO STAND STRL (DRAPES) ×3 IMPLANT
CUFF TOURNIQUET SINGLE 18IN (TOURNIQUET CUFF) IMPLANT
DECANTER SPIKE VIAL GLASS SM (MISCELLANEOUS) IMPLANT
DRAPE EXTREMITY T 121X128X90 (DRAPE) ×3 IMPLANT
DRAPE OEC MINIVIEW 54X84 (DRAPES) ×3 IMPLANT
DRAPE SURG 17X23 STRL (DRAPES) ×3 IMPLANT
GAUZE SPONGE 4X4 12PLY STRL (GAUZE/BANDAGES/DRESSINGS) ×3 IMPLANT
GAUZE SPONGE 4X4 16PLY XRAY LF (GAUZE/BANDAGES/DRESSINGS) IMPLANT
GAUZE XEROFORM 1X8 LF (GAUZE/BANDAGES/DRESSINGS) ×3 IMPLANT
GLOVE SKINSENSE NS SZ7.5 (GLOVE) ×2
GLOVE SKINSENSE STRL SZ7.5 (GLOVE) ×1 IMPLANT
GLOVE SURG SYN 7.5  E (GLOVE) ×2
GLOVE SURG SYN 7.5 E (GLOVE) ×1 IMPLANT
GOWN STRL REIN XL XLG (GOWN DISPOSABLE) ×3 IMPLANT
GOWN STRL REUS W/ TWL LRG LVL3 (GOWN DISPOSABLE) ×1 IMPLANT
GOWN STRL REUS W/TWL LRG LVL3 (GOWN DISPOSABLE) ×2
K-WIRE 1.6 (WIRE) ×4
K-WIRE FX5X1.6XNS BN SS (WIRE) ×2
KWIRE FX5X1.6XNS BN SS (WIRE) ×2 IMPLANT
NEEDLE HYPO 22GX1.5 SAFETY (NEEDLE) IMPLANT
NS IRRIG 1000ML POUR BTL (IV SOLUTION) ×3 IMPLANT
PACK BASIN DAY SURGERY FS (CUSTOM PROCEDURE TRAY) ×3 IMPLANT
PAD CAST 3X4 CTTN HI CHSV (CAST SUPPLIES) ×1 IMPLANT
PADDING CAST ABS 3INX4YD NS (CAST SUPPLIES)
PADDING CAST ABS COTTON 3X4 (CAST SUPPLIES) IMPLANT
PADDING CAST COTTON 3X4 STRL (CAST SUPPLIES) ×2
PADDING CAST SYNTHETIC 3 NS LF (CAST SUPPLIES) ×4
PADDING CAST SYNTHETIC 3X4 NS (CAST SUPPLIES) ×2 IMPLANT
PADDING CAST SYNTHETIC 4 (CAST SUPPLIES)
PADDING CAST SYNTHETIC 4X4 STR (CAST SUPPLIES) IMPLANT
PLATE STANDARD DVR LEFT (Plate) ×3 IMPLANT
PLATE STD DVR LT 24X51 (Plate) ×1 IMPLANT
RUBBERBAND STERILE (MISCELLANEOUS) ×6 IMPLANT
SCREW LOCK 14X2.7X 3 LD TPR (Screw) ×1 IMPLANT
SCREW LOCK 16X2.7X 3 LD TPR (Screw) ×1 IMPLANT
SCREW LOCK 18X2.7X 3 LD TPR (Screw) ×1 IMPLANT
SCREW LOCK 20X2.7X 3 LD TPR (Screw) ×2 IMPLANT
SCREW LOCK 22X2.7X 3 LD TPR (Screw) ×4 IMPLANT
SCREW LOCKING 2.7X14 (Screw) ×2 IMPLANT
SCREW LOCKING 2.7X15MM (Screw) ×3 IMPLANT
SCREW LOCKING 2.7X16 (Screw) ×2 IMPLANT
SCREW LOCKING 2.7X18 (Screw) ×2 IMPLANT
SCREW LOCKING 2.7X20MM (Screw) ×4 IMPLANT
SCREW LOCKING 2.7X22MM (Screw) ×8 IMPLANT
SCREW NLOCK 24X2.7 3 LD (Screw) ×1 IMPLANT
SCREW NONLOCK 2.7X20MM (Screw) ×3 IMPLANT
SCREW NONLOCK 2.7X24 (Screw) ×2 IMPLANT
SLEEVE SCD COMPRESS KNEE MED (MISCELLANEOUS) ×3 IMPLANT
SPLINT FIBERGLASS 3X35 (CAST SUPPLIES) ×3 IMPLANT
SPONGE LAP 18X18 X RAY DECT (DISPOSABLE) ×3 IMPLANT
STOCKINETTE 4X48 STRL (DRAPES) ×3 IMPLANT
SUCTION FRAZIER HANDLE 10FR (MISCELLANEOUS) ×2
SUCTION TUBE FRAZIER 10FR DISP (MISCELLANEOUS) ×1 IMPLANT
SUT ETHILON 4 0 PS 2 18 (SUTURE) ×3 IMPLANT
SUT VIC AB 2-0 SH 27 (SUTURE)
SUT VIC AB 2-0 SH 27XBRD (SUTURE) IMPLANT
SUT VIC AB 3-0 PS1 18 (SUTURE) ×2
SUT VIC AB 3-0 PS1 18XBRD (SUTURE) ×1 IMPLANT
SYR BULB 3OZ (MISCELLANEOUS) ×3 IMPLANT
SYR CONTROL 10ML LL (SYRINGE) IMPLANT
TOWEL OR 17X24 6PK STRL BLUE (TOWEL DISPOSABLE) ×6 IMPLANT
TOWEL OR NON WOVEN STRL DISP B (DISPOSABLE) ×3 IMPLANT
TRAY DSU PREP LF (CUSTOM PROCEDURE TRAY) ×3 IMPLANT
TUBE CONNECTING 20'X1/4 (TUBING) ×1
TUBE CONNECTING 20X1/4 (TUBING) ×2 IMPLANT
UNDERPAD 30X30 (UNDERPADS AND DIAPERS) ×3 IMPLANT
YANKAUER SUCT BULB TIP NO VENT (SUCTIONS) IMPLANT

## 2016-08-23 NOTE — Anesthesia Postprocedure Evaluation (Signed)
Anesthesia Post Note  Patient: Christopher Aguilar  Procedure(s) Performed: Procedure(s) (LRB): OPEN REDUCTION INTERNAL FIXATION (ORIF) LEFT DISTAL RADIUS FRACTURE (Left)  Patient location during evaluation: PACU Anesthesia Type: Regional Level of consciousness: awake and alert Pain management: pain level controlled Vital Signs Assessment: post-procedure vital signs reviewed and stable Respiratory status: spontaneous breathing, nonlabored ventilation, respiratory function stable and patient connected to nasal cannula oxygen Cardiovascular status: stable and blood pressure returned to baseline Anesthetic complications: no       Last Vitals:  Vitals:   08/23/16 1053 08/23/16 1106  BP: 116/76 130/76  Pulse: 74 79  Resp: 15 18  Temp:  36.4 C    Last Pain:  Vitals:   08/23/16 1106  TempSrc: Oral  PainSc: 0-No pain                 Kennieth RadFitzgerald, Sherri Mcarthy E

## 2016-08-23 NOTE — Discharge Instructions (Signed)
Call your surgeon if you experience:   1.  Fever over 101.0. 2.  Inability to urinate. 3.  Nausea and/or vomiting. 4.  Extreme swelling or bruising at the surgical site. 5.  Continued bleeding from the incision. 6.  Increased pain, redness or drainage from the incision. 7.  Problems related to your pain medication. 8.  Any problems and/or concerns Post Anesthesia Home Care Instructions  Activity: Get plenty of rest for the remainder of the day. A responsible adult should stay with you for 24 hours following the procedure.  For the next 24 hours, DO NOT: -Drive a car -Advertising copywriterperate machinery -Drink alcoholic beverages -Take any medication unless instructed by your physician -Make any legal decisions or sign important papers.  Meals: Start with liquid foods such as gelatin or soup. Progress to regular foods as tolerated. Avoid greasy, spicy, heavy foods. If nausea and/or vomiting occur, drink only clear liquids until the nausea and/or vomiting subsides. Call your physician if vomiting continues.  Special Instructions/Symptoms: Your throat may feel dry or sore from the anesthesia or the breathing tube placed in your throat during surgery. If this causes discomfort, gargle with warm salt water. The discomfort should disappear within 24 hours.  If you had a scopolamine patch placed behind your ear for the management of post- operative nausea and/or vomiting:  1. The medication in the patch is effective for 72 hours, after which it should be removed.  Wrap patch in a tissue and discard in the trash. Wash hands thoroughly with soap and water. 2. You may remove the patch earlier than 72 hours if you experience unpleasant side effects which may include dry mouth, dizziness or visual disturbances. 3. Avoid touching the patch. Wash your hands with soap and water after contact with the patch.   Regional Anesthesia Blocks  1. Numbness or the inability to move the "blocked" extremity may last from  3-48 hours after placement. The length of time depends on the medication injected and your individual response to the medication. If the numbness is not going away after 48 hours, call your surgeon.  2. The extremity that is blocked will need to be protected until the numbness is gone and the  Strength has returned. Because you cannot feel it, you will need to take extra care to avoid injury. Because it may be weak, you may have difficulty moving it or using it. You may not know what position it is in without looking at it while the block is in effect.  3. For blocks in the legs and feet, returning to weight bearing and walking needs to be done carefully. You will need to wait until the numbness is entirely gone and the strength has returned. You should be able to move your leg and foot normally before you try and bear weight or walk. You will need someone to be with you when you first try to ensure you do not fall and possibly risk injury.  4. Bruising and tenderness at the needle site are common side effects and will resolve in a few days.  5. Persistent numbness or new problems with movement should be communicated to the surgeon or the [facility] [phone])/ [facility] [phone]).   Postoperative instructions:  Weightbearing instructions: non weight bearing  Dressing instructions: Keep your dressing and/or splint clean and dry at all times.  It will be removed at your first post-operative appointment.  Your stitches and/or staples will be removed at this visit.  Incision instructions:  Do not soak your incision for 3 weeks after surgery.  If the incision gets wet, pat dry and do not scrub the incision.  Pain control:  You have been given a prescription to be taken as directed for post-operative pain control.  In addition, elevate the operative extremity above the heart at all times to prevent swelling and throbbing pain.  Take over-the-counter Colace, 100mg   by mouth twice a day while taking narcotic pain medications to help prevent constipation.  Follow up appointments: 1) 10-14 days for suture removal and wound check. 2) Dr. Roda Shutters as scheduled.   -------------------------------------------------------------------------------------------------------------  After Surgery Pain Control:  After your surgery, post-surgical discomfort or pain is likely. This discomfort can last several days to a few weeks. At certain times of the day your discomfort may be more intense.  Did you receive a nerve block?  A nerve block can provide pain relief for one hour to two days after your surgery. As long as the nerve block is working, you will experience little or no sensation in the area the surgeon operated on.  As the nerve block wears off, you will begin to experience pain or discomfort. It is very important that you begin taking your prescribed pain medication before the nerve block fully wears off. Treating your pain at the first sign of the block wearing off will ensure your pain is better controlled and more tolerable when full-sensation returns. Do not wait until the pain is intolerable, as the medicine will be less effective. It is better to treat pain in advance than to try and catch up.  General Anesthesia:  If you did not receive a nerve block during your surgery, you will need to start taking your pain medication shortly after your surgery and should continue to do so as prescribed by your surgeon.  Pain Medication:  Most commonly we prescribe Vicodin and Percocet for post-operative pain. Both of these medications contain a combination of acetaminophen (Tylenol) and a narcotic to help control pain.   It takes between 30 and 45 minutes before pain medication starts to work. It is important to take your medication before your pain level gets too intense.   Nausea is a common side effect of many pain medications. You will want to eat something before taking  your pain medicine to help prevent nausea.   If you are taking a prescription pain medication that contains acetaminophen, we recommend that you do not take additional over the counter acetaminophen (Tylenol).  Other pain relieving options:   Using a cold pack to ice the affected area a few times a day (15 to 20 minutes at a time) can help to relieve pain, reduce swelling and bruising.   Elevation of the affected area can also help to reduce pain and swelling.

## 2016-08-23 NOTE — H&P (Signed)
PREOPERATIVE H&P  Chief Complaint: left distal radius fracture  HPI: Christopher Aguilar is a 66 y.o. male who presents for surgical treatment of left distal radius fracture.  He denies any changes in medical history.  Past Medical History:  Diagnosis Date  . GERD (gastroesophageal reflux disease)   . Hypertension   . Polio    as a child, has a very mild limp.   Past Surgical History:  Procedure Laterality Date  . GALLBLADDER SURGERY  03/2015   Social History   Social History  . Marital status: Single    Spouse name: N/A  . Number of children: N/A  . Years of education: N/A   Social History Main Topics  . Smoking status: Never Smoker  . Smokeless tobacco: Never Used  . Alcohol use No  . Drug use: No  . Sexual activity: Not Asked   Other Topics Concern  . None   Social History Narrative  . None   Family History  Problem Relation Age of Onset  . Diabetes Mother   . Hypertension Mother   . Cancer Sister   . Diabetes Brother   . Hypertension Brother    Allergies  Allergen Reactions  . Penicillins Hives    Has patient had a PCN reaction causing immediate rash, facial/tongue/throat swelling, SOB or lightheadedness with hypotension: Yes Has patient had a PCN reaction causing severe rash involving mucus membranes or skin necrosis: No Has patient had a PCN reaction that required hospitalization No Has patient had a PCN reaction occurring within the last 10 years: No If all of the above answers are "NO", then may proceed with Cephalosporin use.    Prior to Admission medications   Medication Sig Start Date End Date Taking? Authorizing Provider  Ascorbic Acid (VITAMIN C) 1000 MG tablet Take 1,000 mg by mouth daily.   Yes Historical Provider, MD  docusate sodium (COLACE) 100 MG capsule Take 100 mg by mouth 2 (two) times daily.   Yes Historical Provider, MD  ibuprofen (ADVIL,MOTRIN) 600 MG tablet Take 1 tablet (600 mg total) by mouth every 6 (six) hours as needed.  08/11/16  Yes Jacalyn Lefevre, MD  lisinopril (PRINIVIL,ZESTRIL) 20 MG tablet Take 20 mg by mouth daily.   Yes Historical Provider, MD  Multiple Vitamin (MULTIVITAMIN WITH MINERALS) TABS tablet Take 1 tablet by mouth daily.   Yes Historical Provider, MD  naproxen sodium (ANAPROX) 220 MG tablet Take 220 mg by mouth 2 (two) times daily with a meal.   Yes Historical Provider, MD  omeprazole (PRILOSEC) 20 MG capsule Take 20 mg by mouth every morning.   Yes Historical Provider, MD  Probiotic Product (PHILLIPS COLON HEALTH PO) Take 1 capsule by mouth daily.   Yes Historical Provider, MD  ranitidine (ZANTAC) 150 MG tablet Take 150 mg by mouth at bedtime.   Yes Historical Provider, MD  traMADol (ULTRAM) 50 MG tablet Take 1 tablet (50 mg total) by mouth every 6 (six) hours as needed. 08/17/16  Yes Naiping Donnelly Stager, MD     Positive ROS: All other systems have been reviewed and were otherwise negative with the exception of those mentioned in the HPI and as above.  Physical Exam: General: Alert, no acute distress Cardiovascular: No pedal edema Respiratory: No cyanosis, no use of accessory musculature GI: abdomen soft Skin: No lesions in the area of chief complaint Neurologic: Sensation intact distally Psychiatric: Patient is competent for consent with normal mood and affect Lymphatic: no lymphedema  MUSCULOSKELETAL: exam  stable  Assessment: left distal radius fracture  Plan: Plan for Procedure(s): OPEN REDUCTION INTERNAL FIXATION (ORIF) LEFT DISTAL RADIUS FRACTURE  The risks benefits and alternatives were discussed with the patient including but not limited to the risks of nonoperative treatment, versus surgical intervention including infection, bleeding, nerve injury,  blood clots, cardiopulmonary complications, morbidity, mortality, among others, and they were willing to proceed.   Glee ArvinMichael Xu, MD   08/23/2016 7:24 AM

## 2016-08-23 NOTE — Anesthesia Procedure Notes (Signed)
Procedure Name: MAC Date/Time: 08/23/2016 9:03 AM Performed by: Baxter Flattery Pre-anesthesia Checklist: Patient identified, Emergency Drugs available, Suction available and Patient being monitored Patient Re-evaluated:Patient Re-evaluated prior to inductionOxygen Delivery Method: Simple face mask Preoxygenation: Pre-oxygenation with 100% oxygen Intubation Type: IV induction Dental Injury: Teeth and Oropharynx as per pre-operative assessment

## 2016-08-23 NOTE — Op Note (Signed)
   DATE OF SURGERY: 08/23/2016  PREOPERATIVE DIAGNOSIS: left distal radius fracture  POSTOPERATIVE DIAGNOSIS: same  PROCEDURE: Open reduction and internal fixation, Left  distal radius. CPT (669) [id number] 3 or more fragments  IMPLANT:  Implant Name Type Inv. Item Serial No. Manufacturer Lot No. LRB No. Used  LOG [id number] - [employer] BI[employer]ET CROSSLOCK DVR SET - 1         PLATE STANDARD DVR LEFT - [id number] Plate PLATE STANDARD DVR LEFT  ZIMMER CAROLINAS  Left 1  SCREW LOCKING 2.7X15MM - [id number] Screw SCREW LOCKING 2.7X15MM  ZIMMER CAROLINAS  Left 1  SCREW LOCKING 2.7X14 - [id number] Screw SCREW LOCKING 2.7X14  ZIMMER CAROLINAS  Left 1  SCREW LOCKING 2.7X22MM - [id number] Screw SCREW LOCKING 2.7X22MM  ZIMMER CAROLINAS  Left 4  SCREW LOCKING 2.7X20MM - [id number] Screw SCREW LOCKING 2.7X20MM  ZIMMER CAROLINAS  Left 1  SCREW LOCKING 2.7X18 - [id number] Screw SCREW LOCKING 2.7X18  ZIMMER CAROLINAS  Left 1  SCREW LOCKING 2.7X16 - [id number] Screw SCREW LOCKING 2.7X16  ZIMMER CAROLINAS  Left 1  SCREW NONLOCK 2.7X20MM - [id number] Screw SCREW NONLOCK 2.7X20MM  ZIMMER CAROLINAS  Left 1  SCREW NONLOCK 2.7X24 - [id number] Screw SCREW NONLOCK 2.7X24  ZIMMER CAROLINAS  Left 1  SCREW LOCKING 2.7X20MM - [id number] Screw SCREW LOCKING 2.7X20MM   ZIMMER CAROLINAS   Left 1     SURGEON: Surgeon(s) and Role:    * Aaden Buckman Donnelly StagerM Johnwilliam Shepperson, MD - Primary  ANESTHESIA: regional  TOURNIQUET TIME: see anesthesia record  BLOOD LOSS: Minimal.  COMPLICATIONS: None.  PATHOLOGY: None.  TIME OUT: Performed before the start of procedure.  INDICATIONS: The patient is a 66 y.o. male who presented with a displaced left distal radius fracture indicated for osteosynthesis.  DESCRIPTION OF PROCEDURE:  The patient was identified in the preoperative holding area.  The operative site was marked by the surgeon and confirmed by the patient.  He was brought back to the operating room.  Anesthesia was induced by the anesthesia team.  A well padded  nonsterile tourniquet was placed. The operative extremity was prepped and draped in standard sterile fashion.  A timeout was performed.  Preoperative antibiotics were given. The extremity was exsanguinated, tourniquet inflated to 250 mmHg.  A FCR approach was used.  Dissection through the sheath of FCR was carried out and the FCR tendon was retracted. The floor of FCR sheath was released. Flexor tendons and median nerve were retracted ulnarly and protected. Pronator quadratus was released from distal radius. Fracture lines were visualized. Fracture was reduced under fluoroscopy. A volar locking plate was applied to the volar surface of the distal radius. Distal locking screws and nonlocking shaft screws were inserted. Fluoroscopy was used to show adequate reduction. The tourniquet was deflated. Hemostasis was achieved. The wound was irrigated. Incision closed in layers. Sterile dressing applied. Wrist immobilized in a removable brace. The patient was transferred to the recovery room in stable condition after all counts were correct.  POSTOPERATIVE PLAN: The patient will remain in the brace until instructed. Sutures will be removed in two weeks. About four weeks after surgery, will start wrist range of motion exercises, but in the meantime before then she will start aggressive finger range of motion exercises without resistance.  The "six pack of hand exercises" handout was given to the patient.

## 2016-08-23 NOTE — Anesthesia Procedure Notes (Signed)
Anesthesia Regional Block:  Interscalene brachial plexus block  Pre-Anesthetic Checklist: ,, timeout performed, Correct Patient, Correct Site, Correct Laterality, Correct Procedure, Correct Position, site marked, Risks and benefits discussed,  Surgical consent,  Pre-op evaluation,  At surgeon's request and post-op pain management  Laterality: Left  Prep: chloraprep       Needles:  Injection technique: Single-shot  Needle Type: Stimulator Needle - 40     Needle Length: 4cm 4 cm Needle Gauge: 22 and 22 G    Additional Needles:  Procedures: ultrasound guided (picture in chart)  Motor weakness within 5 minutes. Interscalene brachial plexus block Narrative:  Start time: 08/23/2016 7:58 AM End time: 08/23/2016 8:08 AM Injection made incrementally with aspirations every 5 mL. Anesthesiologist: Lewie LoronGERMEROTH, Derl Abalos  Additional Notes: BP cuff, EKG monitors applied. Sedation begun. Nerve location verified with U/S. Anesthetic injected incrementally, slowly , and after neg aspirations under direct u/s guidance. Good perineural spread. Tolerated well.

## 2016-08-23 NOTE — Transfer of Care (Signed)
Immediate Anesthesia Transfer of Care Note  Patient: Christopher Aguilar  Procedure(s) Performed: Procedure(s): OPEN REDUCTION INTERNAL FIXATION (ORIF) LEFT DISTAL RADIUS FRACTURE (Left)  Patient Location: PACU  Anesthesia Type:MAC and Regional  Level of Consciousness: awake, alert , oriented and patient cooperative  Airway & Oxygen Therapy: Patient Spontanous Breathing and Patient connected to face mask oxygen  Post-op Assessment: Report given to RN, Post -op Vital signs reviewed and stable and Patient moving all extremities  Post vital signs: Reviewed and stable  Last Vitals:  Vitals:   08/23/16 0805 08/23/16 0810  BP: (!) 146/88 (!) 141/76  Pulse: 78 78  Resp: 13 14    Last Pain:  Vitals:   08/23/16 0746  TempSrc: Oral  PainSc: 1       Patients Stated Pain Goal: 1 (28/36/62 9476)  Complications: No apparent anesthesia complications

## 2016-08-23 NOTE — Progress Notes (Signed)
Assisted Dr. Germeroth with left, ultrasound guided, axillary block. Side rails up, monitors on throughout procedure. See vital signs in flow sheet. Tolerated Procedure well. 

## 2016-08-23 NOTE — Anesthesia Preprocedure Evaluation (Addendum)
Anesthesia Evaluation  Patient identified by MRN, date of birth, ID band Patient awake    Reviewed: Allergy & Precautions, NPO status , Patient's Chart, lab work & pertinent test results  Airway Mallampati: II  TM Distance: >3 FB Neck ROM: Full    Dental no notable dental hx.    Pulmonary neg pulmonary ROS,    Pulmonary exam normal breath sounds clear to auscultation       Cardiovascular hypertension, negative cardio ROS Normal cardiovascular exam Rhythm:Regular Rate:Normal     Neuro/Psych negative neurological ROS  negative psych ROS   GI/Hepatic Neg liver ROS, GERD  ,  Endo/Other  negative endocrine ROS  Renal/GU negative Renal ROS     Musculoskeletal negative musculoskeletal ROS (+)   Abdominal   Peds  Hematology negative hematology ROS (+)   Anesthesia Other Findings   Reproductive/Obstetrics negative OB ROS                             Anesthesia Physical Anesthesia Plan  ASA: II  Anesthesia Plan: Regional   Post-op Pain Management:    Induction:   Airway Management Planned:   Additional Equipment:   Intra-op Plan:   Post-operative Plan:   Informed Consent: I have reviewed the patients History and Physical, chart, labs and discussed the procedure including the risks, benefits and alternatives for the proposed anesthesia with the patient or authorized representative who has indicated his/her understanding and acceptance.   Dental advisory given  Plan Discussed with: CRNA  Anesthesia Plan Comments:        Anesthesia Quick Evaluation

## 2016-08-24 ENCOUNTER — Encounter (HOSPITAL_BASED_OUTPATIENT_CLINIC_OR_DEPARTMENT_OTHER): Payer: Self-pay | Admitting: Orthopaedic Surgery

## 2016-09-07 ENCOUNTER — Ambulatory Visit (INDEPENDENT_AMBULATORY_CARE_PROVIDER_SITE_OTHER): Payer: Worker's Compensation | Admitting: Orthopaedic Surgery

## 2016-09-07 ENCOUNTER — Ambulatory Visit (INDEPENDENT_AMBULATORY_CARE_PROVIDER_SITE_OTHER): Payer: Self-pay

## 2016-09-07 DIAGNOSIS — S52572D Other intraarticular fracture of lower end of left radius, subsequent encounter for closed fracture with routine healing: Secondary | ICD-10-CM | POA: Diagnosis not present

## 2016-09-07 NOTE — Progress Notes (Signed)
Patient is 2 weeks s/p ORIF left distal radius.  Pain is well controlled.  He's doing home hand exercises.  Surgical incision is well healed.  No signs of infection.  Xrays show stable ORIF without complication.  Sutures were removed.  Wrist brace x 4 weeks at all times.  Platform weight bearing.  F/u 4 weeks for repeat xrays of left wrist.

## 2016-10-05 ENCOUNTER — Encounter (INDEPENDENT_AMBULATORY_CARE_PROVIDER_SITE_OTHER): Payer: Self-pay | Admitting: Orthopaedic Surgery

## 2016-10-05 ENCOUNTER — Telehealth (INDEPENDENT_AMBULATORY_CARE_PROVIDER_SITE_OTHER): Payer: Self-pay

## 2016-10-05 ENCOUNTER — Ambulatory Visit (INDEPENDENT_AMBULATORY_CARE_PROVIDER_SITE_OTHER): Payer: Worker's Compensation | Admitting: Orthopaedic Surgery

## 2016-10-05 ENCOUNTER — Ambulatory Visit (INDEPENDENT_AMBULATORY_CARE_PROVIDER_SITE_OTHER): Payer: Self-pay

## 2016-10-05 DIAGNOSIS — S52572D Other intraarticular fracture of lower end of left radius, subsequent encounter for closed fracture with routine healing: Secondary | ICD-10-CM | POA: Diagnosis not present

## 2016-10-05 NOTE — Progress Notes (Signed)
Patient is 6 weeks status post operative fixation left distal radius fracture. He is doing better. He still has some stiffness. Physical exam shows limited range of motion of the wrist flexion and extension. His surgical scar is well-healed. X-rays show stable alignment of the fractures. At this point begin hand therapy for mobilization and strengthening. Follow up in 6 weeks with repeat 2 view x-rays of the left wrist.

## 2016-10-05 NOTE — Telephone Encounter (Signed)
Faxed PT rx and todays office note to work comp adj [place] CoachSuzanne Cowan fax # [phone]

## 2016-10-18 ENCOUNTER — Telehealth (INDEPENDENT_AMBULATORY_CARE_PROVIDER_SITE_OTHER): Payer: Self-pay

## 2016-10-18 NOTE — Telephone Encounter (Signed)
Received vm requesting update on this patient. Faxed her the last office note and PT rx

## 2016-11-16 ENCOUNTER — Ambulatory Visit (INDEPENDENT_AMBULATORY_CARE_PROVIDER_SITE_OTHER): Payer: Worker's Compensation | Admitting: Orthopaedic Surgery

## 2016-11-16 ENCOUNTER — Ambulatory Visit (INDEPENDENT_AMBULATORY_CARE_PROVIDER_SITE_OTHER): Payer: Worker's Compensation

## 2016-11-16 DIAGNOSIS — S52502A Unspecified fracture of the lower end of left radius, initial encounter for closed fracture: Secondary | ICD-10-CM | POA: Diagnosis not present

## 2016-11-16 DIAGNOSIS — S52602A Unspecified fracture of lower end of left ulna, initial encounter for closed fracture: Principal | ICD-10-CM

## 2016-11-16 NOTE — Progress Notes (Signed)
Christopher Aguilar is almost 3 months status post ORIF of a severely comminuted distal radius fracture. Overall he is improving with hand therapy. He continues to complain of some discomfort over the ulnar styloid. He really is not complaining of any radial sided pain. His strength is improving. He's progressing with hand therapy. He's point tender over the ulnar styloid. Range of motion is progressing appropriately. I discussed with him that I think the pain is coming from his displaced ulna styloid fracture. I would expect this to continue to get better as it scars down. I don't see any reason to inject it. Certainly not indicated for any sort of surgery. I'll see him back in 2 months for recheck.

## 2017-01-16 ENCOUNTER — Ambulatory Visit (INDEPENDENT_AMBULATORY_CARE_PROVIDER_SITE_OTHER): Payer: Self-pay

## 2017-01-16 ENCOUNTER — Encounter (INDEPENDENT_AMBULATORY_CARE_PROVIDER_SITE_OTHER): Payer: Self-pay | Admitting: Orthopaedic Surgery

## 2017-01-16 ENCOUNTER — Ambulatory Visit (INDEPENDENT_AMBULATORY_CARE_PROVIDER_SITE_OTHER): Payer: Worker's Compensation | Admitting: Orthopaedic Surgery

## 2017-01-16 DIAGNOSIS — S52502A Unspecified fracture of the lower end of left radius, initial encounter for closed fracture: Secondary | ICD-10-CM | POA: Diagnosis not present

## 2017-01-16 DIAGNOSIS — S52602A Unspecified fracture of lower end of left ulna, initial encounter for closed fracture: Secondary | ICD-10-CM | POA: Diagnosis not present

## 2017-01-16 NOTE — Progress Notes (Signed)
   Office Visit Note   Patient: Christopher Aguilar           Date of Birth: 12/20/1950           MRN: [id number] Visit Date: 01/16/2017              Requested by: Louie Bostonapper, Vander B., MD [address] [place], [place] [id number] PCP: Louie Bostonapper, Lexus B., MD   Assessment & Plan: Visit Diagnoses:  1. Closed fracture of left distal radius and ulna, initial encounter     Plan: Patient is essentially MMI at this point. Functionally speaking is doing very well. At this point would release. Follow-up with me as needed.  Follow-Up Instructions: Return if symptoms worsen or fail to improve.   Orders:  Orders Placed This Encounter  Procedures  . XR Wrist Complete Left   No orders of the defined types were placed in this encounter.     Procedures: No procedures performed   Clinical Data: No additional findings.   Subjective: Chief Complaint  Patient presents with  . Left Wrist - Follow-up    Patient is 5 weeks status post ORIF left distal radius fracture. He states that he is feeling better. The ulnar-sided wrist pain has resolved. He does have a little bit of discomfort when pushing himself up from a chair. Otherwise he feels that he is significantly better.    Review of Systems   Objective: Vital Signs: There were no vitals taken for this visit.  Physical Exam  Ortho Exam Left wrist exam shows a fully healed surgical scar. His range of motion is improving significantly. Specialty Comments:  No specialty comments available.  Imaging: Xr Wrist Complete Left  Result Date: 01/16/2017 Stable fixation of fracture. Fracture appears to be fully healed.  There is articular incongruity.    PMFS History: Patient Active Problem List   Diagnosis Date Noted  . Closed fracture of left distal radius   . Closed fracture of left distal radius and ulna, initial encounter 08/17/2016   Past Medical History:  Diagnosis Date  . GERD (gastroesophageal reflux disease)   . Hypertension    . Polio    as a child, has a very mild limp.    Family History  Problem Relation Age of Onset  . Diabetes Mother   . Hypertension Mother   . Cancer Sister   . Diabetes Brother   . Hypertension Brother     Past Surgical History:  Procedure Laterality Date  . CHOLECYSTECTOMY    . GALLBLADDER SURGERY  03/2015  . OPEN REDUCTION INTERNAL FIXATION (ORIF) DISTAL RADIAL FRACTURE Left 08/23/2016   Procedure: OPEN REDUCTION INTERNAL FIXATION (ORIF) LEFT DISTAL RADIUS FRACTURE;  Surgeon: Tarry KosNaiping M Atlas Crossland, MD;  Location: [facility];  Service: Orthopedics;  Laterality: Left;   Social History   Occupational History  . Not on file.   Social History Main Topics  . Smoking status: Never Smoker  . Smokeless tobacco: Never Used  . Alcohol use No  . Drug use: No  . Sexual activity: Not on file

## 2017-01-23 ENCOUNTER — Telehealth (INDEPENDENT_AMBULATORY_CARE_PROVIDER_SITE_OTHER): Payer: Self-pay | Admitting: Orthopaedic Surgery

## 2017-01-23 NOTE — Telephone Encounter (Signed)
[facility] IN [place] REQUESTING PT LAST VISIT NOTE FAXED TO [phone]

## 2017-01-23 NOTE — Telephone Encounter (Signed)
OV Faxed

## 2018-02-07 IMAGING — DX DG WRIST COMPLETE 3+V*L*
3 series · 3 of 3 positions shown · non-contrast
Comparison: None.

CLINICAL DATA: Pain following fall

EXAM:
LEFT WRIST - COMPLETE 3+ VIEW

[wrist pa]
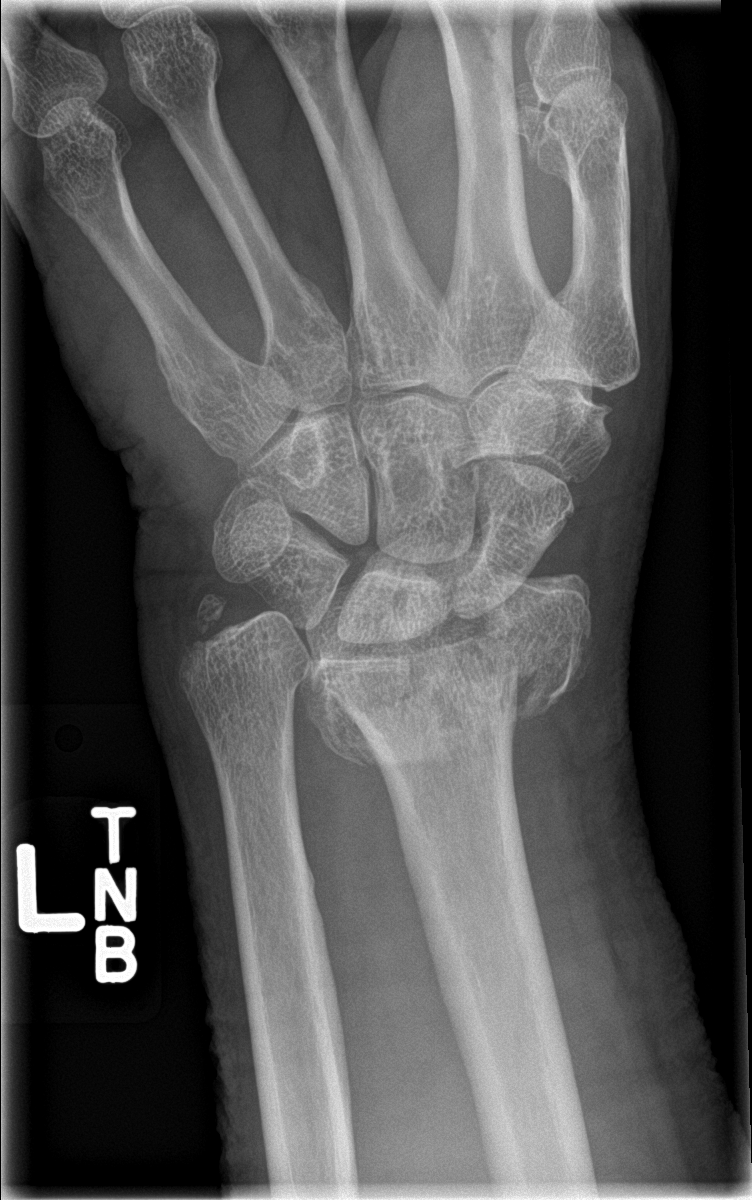

[wrist obl]
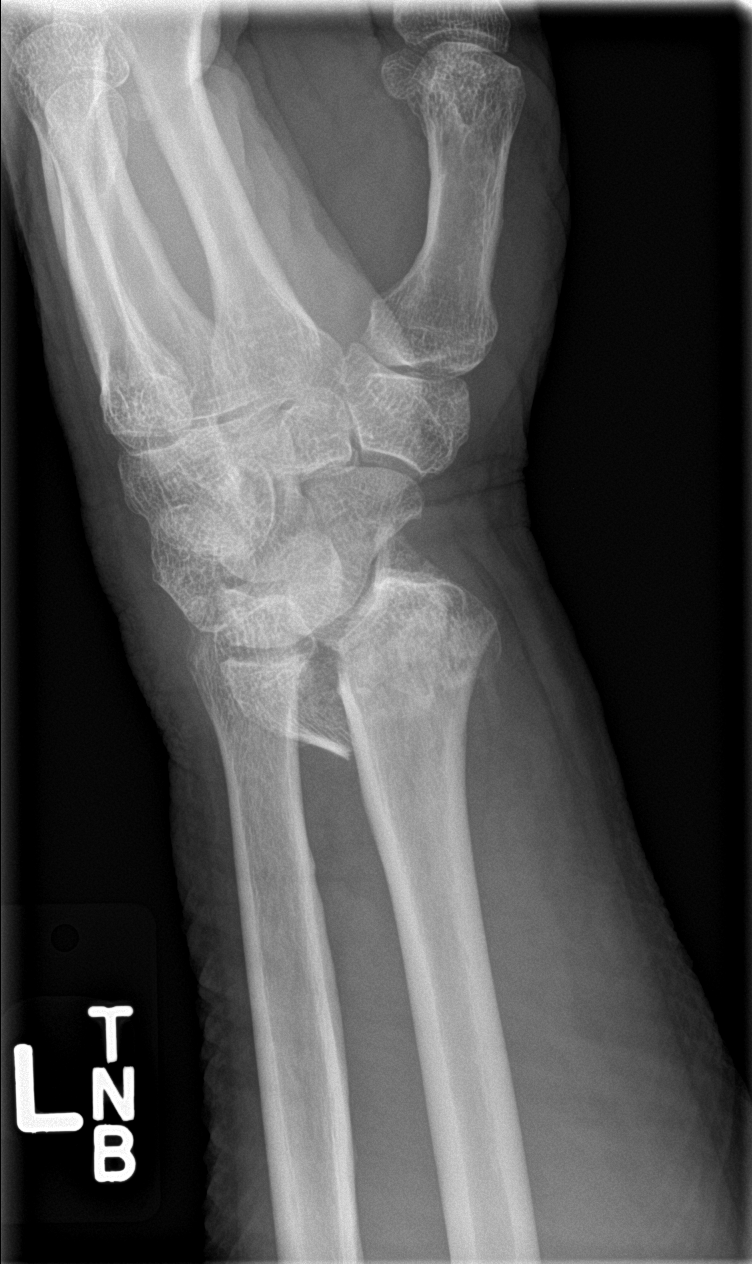

[wrist lat]
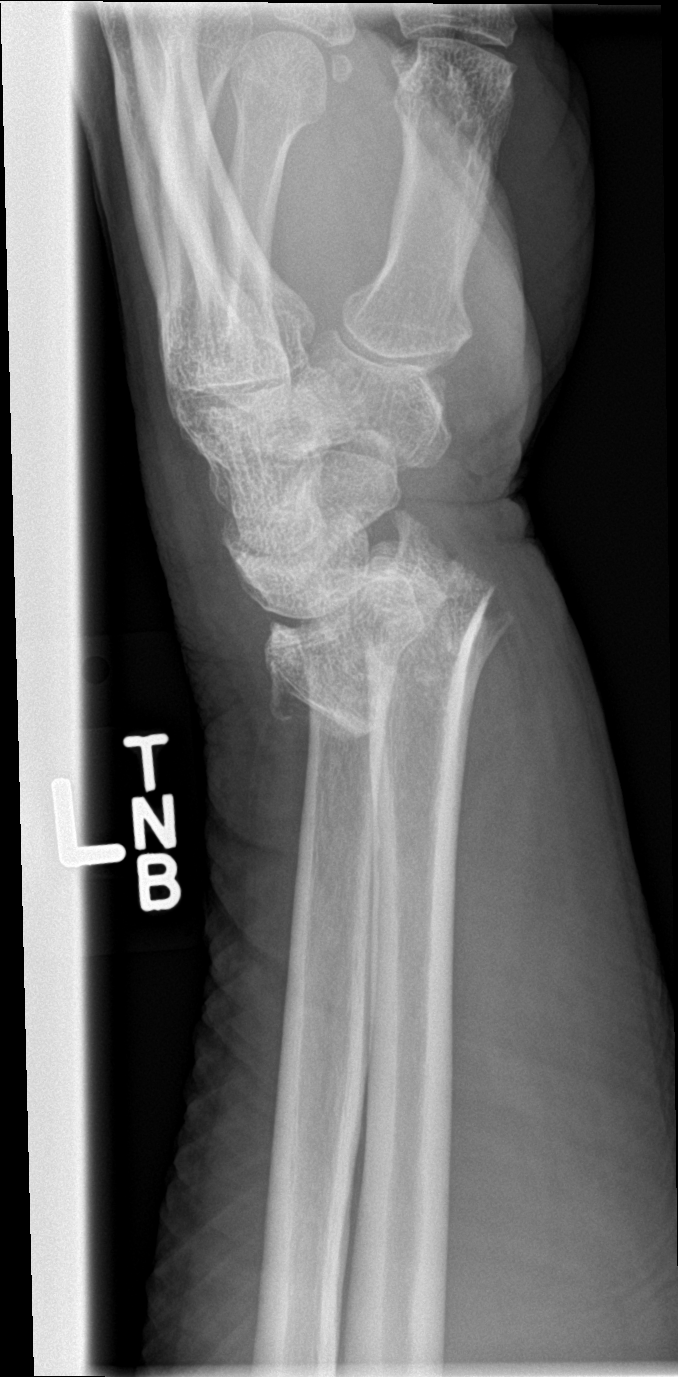

[3 of 3 positions shown; findings below may reference images not displayed]

FINDINGS: Frontal, oblique, and lateral views were obtained. There is a
comminuted fracture of the distal radial metaphysis with dorsal
angulation distally. There is impaction at the fracture site.
Fracture fragments extend into the radiocarpal joint. There is
avulsion of the ulnar styloid. No other fractures are evident. No
dislocation. Joint spaces appear unremarkable.
IMPRESSION: Comminuted fracture distal radial metaphysis with impaction and
dorsal angulation distally. Note that fracture fragments in this
area extend into the radiocarpal joint. There is avulsion the ulnar
styloid. No dislocation. No evident arthropathic change.

## 2018-02-07 IMAGING — CR DG WRIST 2V*L*
1 series · 2 of 2 positions shown · non-contrast
Comparison: Earlier radiographs today at 2216 hours

CLINICAL DATA: Left wrist fracture status postreduction.

EXAM:
LEFT WRIST - 2 VIEW

[Series 2: pa · 0.17mm/px · 2 of 2 slices shown]
[im 1/2]
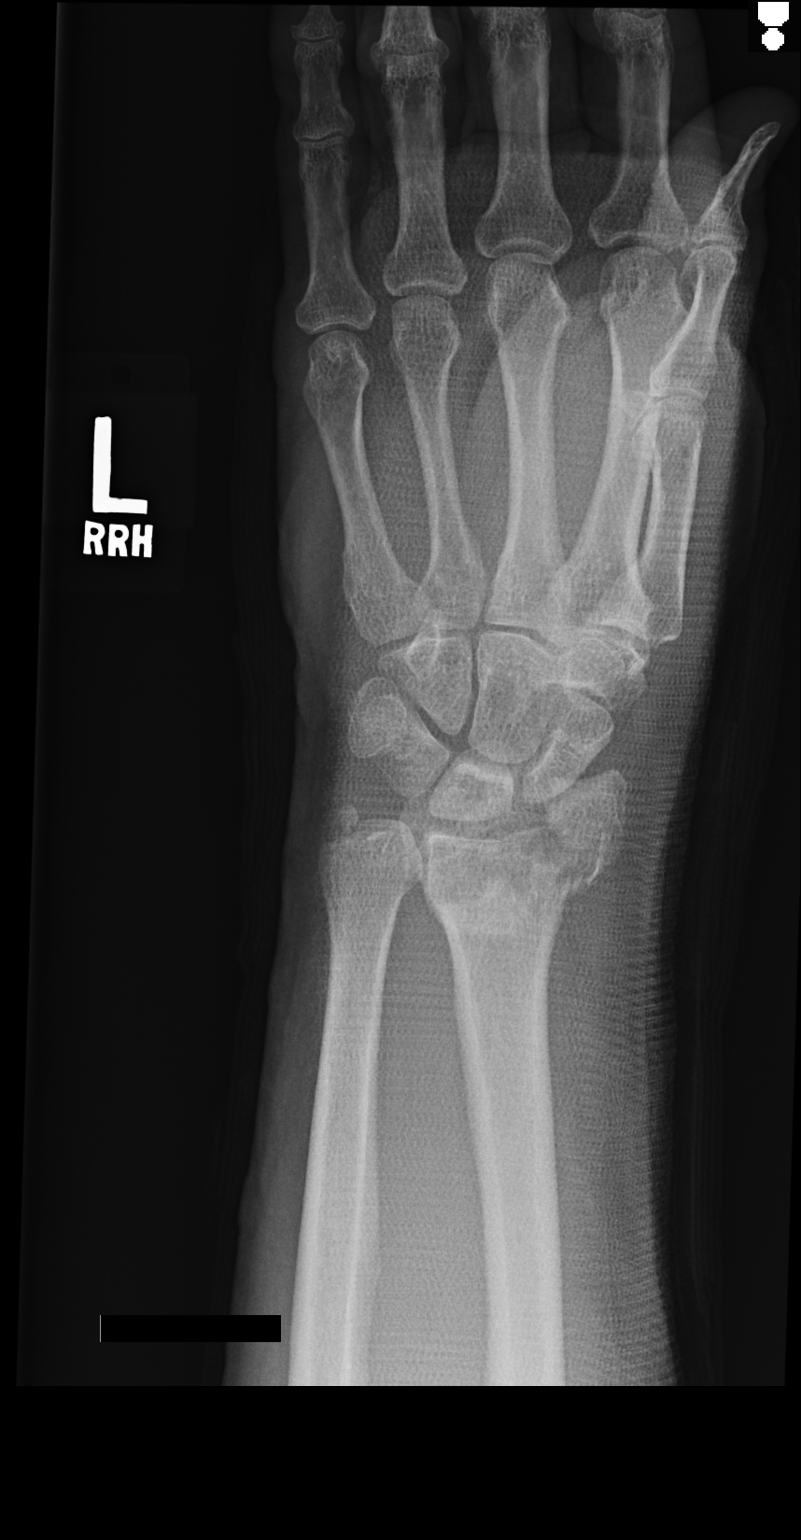
[im 2/2]
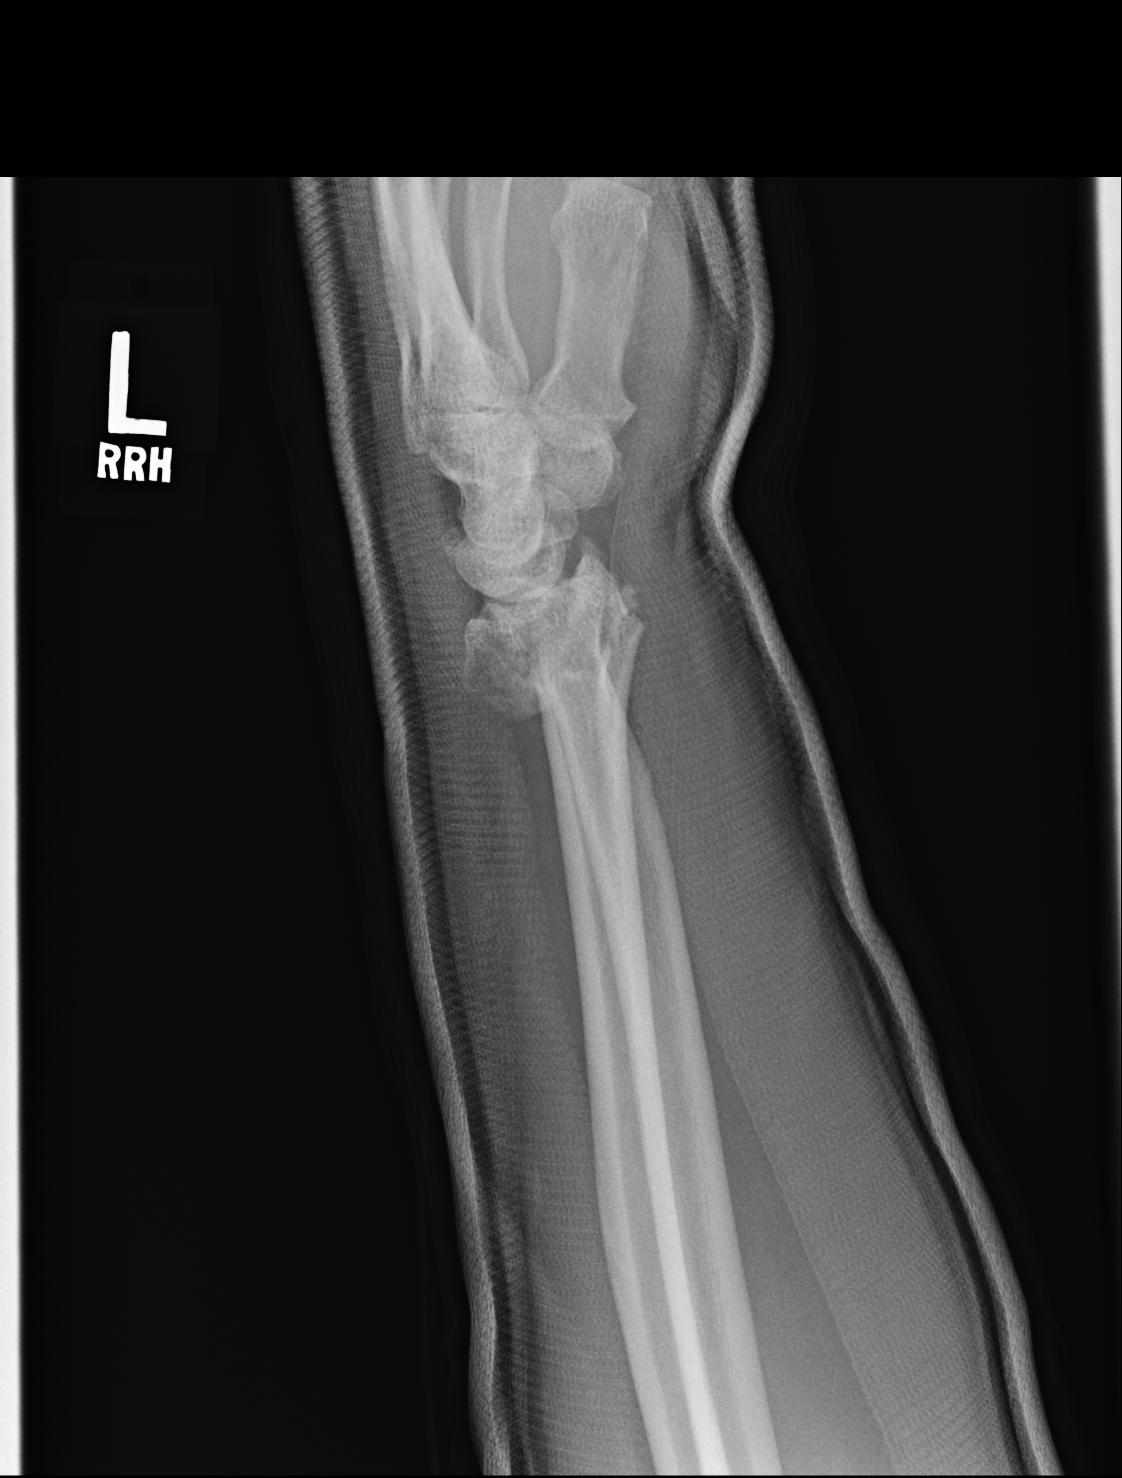

[2 of 2 positions shown; findings below may reference images not displayed]

FINDINGS: A comminuted, impacted intra-articular fracture is again seen
involving the distal radius. There has been partial interval
reduction of the fracture, however there remains significant
impaction and dorsal angulation as well as mild-to-moderate
displacement of the dorsal radius and radial styloid fragments. A
minimally displaced ulnar styloid fracture is again noted. Splint
material has been placed.
IMPRESSION: Interval closed reduction of distal radius fracture with residual
angulation and displacement as above.

## 2018-03-13 ENCOUNTER — Encounter (INDEPENDENT_AMBULATORY_CARE_PROVIDER_SITE_OTHER): Payer: Self-pay | Admitting: Internal Medicine

## 2018-03-13 ENCOUNTER — Ambulatory Visit (INDEPENDENT_AMBULATORY_CARE_PROVIDER_SITE_OTHER): Payer: Medicare Other | Admitting: Internal Medicine

## 2018-03-13 VITALS — BP 120/80 | HR 65 | Temp 98.4°F | Resp 18 | Ht 68.0 in | Wt 170.7 lb

## 2018-03-13 DIAGNOSIS — R110 Nausea: Secondary | ICD-10-CM | POA: Diagnosis not present

## 2018-03-13 DIAGNOSIS — R1010 Upper abdominal pain, unspecified: Secondary | ICD-10-CM

## 2018-03-13 MED ORDER — PANTOPRAZOLE SODIUM 40 MG PO TBEC: 40.0000 mg | DELAYED_RELEASE_TABLET | Freq: Two times a day (BID) | ORAL | 2 refills | Status: DC

## 2018-03-13 NOTE — Progress Notes (Signed)
Presenting complaint;  Burning abdominal pain across upper abdomen and nausea.  History of present illness:  Patient is 67 year old Caucasian male who is self-referred for evaluation of burning across upper abdomen with nausea.  Last saw him over 15 years ago for elevated transaminases secondary to statin. Present illness started on 02/24/2018 when he noted severe burning across upper abdomen with nausea.  He felt as if there was torch in his upper abdomen.  He also felt hot flashes across upper half of his abdomen and felt his blood was boiling.  He did not experience vomiting fever or chills.  He did notice drop as an appetite. His symptoms continued and he was seen by his primary care physician Dr. Angela Cox on 02/27/2018.  She felt his symptoms could be due to Niaspan which was discontinued.  She was also concerned about possibility of peptic ulcer disease and therefore doubled up on his omeprazole and ranitidine.  However over the next few days his symptoms persisted.  He was feeling much better towards the end of last week he had no pain or nausea for 2 days.  He was feeling well on 03/09/2018 when he had a hotdog.  He woke up the next morning with recurrence of his excruciating burning and nausea.  He had a good day yesterday.  However he has been on limited diet.  He still does not have good appetite.  He has lost 7 pounds since his symptoms began.  Over the weekend he had chills but no fever.  He states he had more or less similar symptoms when he was diagnosed with biliary dyskinesia and underwent cholecystectomy.  He states heartburn is well controlled with therapy.  He has been on treatment for more than 10 years.  His upper GI tract is never been evaluated.  He also has had problems with constipation since his acute symptoms began.  In addition to using polyethylene glycol and stool softener he has been using Fleet enemas.  He has used but 2 in the last 1 week.  He denies melena or rectal  bleeding. 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  He has been on Aleve for several months. He says he was on Paxil for anger management but he stopped it over 6 months ago but he did take a dose 3 days ago.  He is now retired and does not have any such issues. He is up-to-date on screening for CRC.  He had colonoscopy by Dr. Olegario Messier in April this year and was a normal exam.  Current Medications: Outpatient Encounter Medications as of 03/13/2018  Medication Sig  . Ascorbic Acid (VITAMIN C) 1000 MG tablet Take 1,000 mg by mouth daily.  Marland Kitchen docusate sodium (COLACE) 100 MG capsule Take 100 mg by mouth 2 (two) times daily.  Marland Kitchen lisinopril (PRINIVIL,ZESTRIL) 20 MG tablet Take 20 mg by mouth daily.  . Multiple Vitamin (MULTIVITAMIN WITH MINERALS) TABS tablet Take 1 tablet by mouth daily.  . Multiple Vitamins-Minerals (MULTI COMPLETE PO) Take by mouth daily.  . naproxen sodium (ANAPROX) 220 MG tablet Take 220 mg by mouth 2 (two) times daily with a meal.  . omeprazole (PRILOSEC) 20 MG capsule Take 20 mg by mouth every morning.  Marland Kitchen OVER THE COUNTER MEDICATION Medication called "Nausea," it is over counter- takes by mouth as needed for nausea.  Marland Kitchen OVER THE COUNTER MEDICATION as needed. Patient states this  is the Equate brand of laxative.  . polyethylene glycol (MIRALAX / GLYCOLAX) packet Take 17 g by mouth daily.  . Probiotic Product (PHILLIPS COLON HEALTH PO) Take 1 capsule by mouth daily.  . ranitidine (ZANTAC) 150 MG tablet Take 150 mg by mouth at bedtime.  . sodium phosphate (FLEET) 7-19 GM/118ML ENEM Place 1 enema rectally. Patient states that he uses no more than twice a week.  . [DISCONTINUED] Ascorbic Acid (VITAMIN C) 500 MG CHEW Chew 1 tablet (500 mg total) by mouth 2 (two) times daily. (Patient not taking: Reported on 03/13/2018)  . [DISCONTINUED] ibuprofen (ADVIL,MOTRIN) 600 MG tablet Take 1 tablet (600 mg total)  by mouth every 6 (six) hours as needed. (Patient not taking: Reported on 03/13/2018)  . [DISCONTINUED] ondansetron (ZOFRAN) 4 MG tablet Take 1-2 tablets (4-8 mg total) by mouth every 8 (eight) hours as needed for nausea or vomiting. (Patient not taking: Reported on 03/13/2018)  . [DISCONTINUED] oxyCODONE (OXYCONTIN) 10 mg 12 hr tablet Take 1 tablet (10 mg total) by mouth every 12 (twelve) hours. (Patient not taking: Reported on 03/13/2018)  . [DISCONTINUED] oxyCODONE-acetaminophen (PERCOCET) 5-325 MG tablet Take 1-2 tablets by mouth every 4 (four) hours as needed for severe pain. (Patient not taking: Reported on 03/13/2018)  . [DISCONTINUED] promethazine (PHENERGAN) 25 MG tablet Take 1 tablet (25 mg total) by mouth every 6 (six) hours as needed for nausea. (Patient not taking: Reported on 03/13/2018)  . [DISCONTINUED] traMADol (ULTRAM) 50 MG tablet Take 1 tablet (50 mg total) by mouth every 6 (six) hours as needed. (Patient not taking: Reported on 03/13/2018)   No facility-administered encounter medications on file as of 03/13/2018.    Past medical history:  Polio at age 89 resulting in left lower extremity muscle wasting and weakness.  Osteoarthrosis of right knee secondary to above.  He was evaluated by orthopedic surgeon several years ago and felt not to be a candidate for knee replacement because of severe myopathy even though he had severe osteoarthrosis.  Hypertension for over 20 years duration.  Evaluated over 15 years ago for elevated transaminases felt to be due to statin.  Chronic GERD.  He has been on current therapy for more than 10 years.  He has never undergone EGD.  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  Cholecystectomy for biliary dyskinesia about 3 years ago.  Open reduction internal fixation of left distal radial fracture in January 2018  Normal screening colonoscopy in April 2019.  Allergies: Allergies  Allergen  Reactions  . Lipitor [Atorvastatin]     This statin and all statins patient is intolerant to. Causes increase in Liver Enzymes.  . Penicillins Hives    Has patient had a PCN reaction causing immediate rash, facial/tongue/throat swelling, SOB or lightheadedness with hypotension: Yes Has patient had a PCN reaction causing severe rash involving mucus membranes or skin necrosis: No Has patient had a PCN reaction that required hospitalization No Has patient had a PCN reaction occurring within the last 10 years: No If all of the above answers are "NO", then may proceed with Cephalosporin use.     Family history:  Mother died of MI at age 66. Father died of auto accident in his 7040s.  He was a Naval architecttruck driver. Brother died of CHF at age 66 Sister diagnosed with rectal carcinoma in her 8270s within 2 years of diagnosis. His son was treated for severe pancreatitis 3 years ago at age 66.  Etiology not clear.  He has not had any further episodes.  Social history:  Patient is married.  They have son age 66 who is known in good health.  History of pancreatitis as above. Patient has never smoked cigarettes and does not drink alcohol. 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 for 9 years until his retirement.  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  This is his 43rd year.   Physical examination: Blood pressure 120/80, pulse 65, temperature 98.4 F (36.9 C), temperature source Oral, resp. rate 18, height 5\' 8"  (1.727 m), weight 170 lb 11.2 oz (77.4 kg). Well-developed well-nourished Caucasian male in NAD. Conjunctiva is pink. Sclera is nonicteric Oropharyngeal mucosa is normal. He has partial upper dental plate. No neck masses or thyromegaly noted. Cardiac exam with regular rhythm normal S1 and S2. No murmur or gallop noted. Lungs are clear to auscultation. Abdomen is symmetrical with indistinct subcostal laparoscopy scars.  Bowel  sounds are normal.  No bruit noted.  On palpation abdomen is soft and nontender with organomegaly or masses. Rectal examination reveals scant amount of stool in the vault and it is guaiac negative. No LE edema or clubbing noted. Marked atrophy of left quadriceps and hamstrings with some atrophy of peroneal muscles.  Labs/studies Results:  No recent lab available.  Assessment:  Patient is 67 year old Caucasian male who presents with 2-1/2-week history of burning pain across his upper abdomen associated with nausea anorexia and 7 pound weight loss.  Niacin was discontinued by Dr. Otilio MiuBarrino and PPI dose was doubled but without symptomatic improvement.  He is status post cholecystectomy for biliary dyskinesia few years ago.  Patient has severe left knee osteoarthrosis and has been on NSAID chronically.  He has not experienced melena.  He possibly has peptic ulcer disease.  Other diagnoses include choledocholithiasis but symptoms are typical. He has not responded to double dose PPI and may benefit from change to a different PPI.  Chronic GERD.  Symptoms well controlled with therapy.  Recommendations:  Discontinue Aleve. Can take Tylenol up to 2 g/day in divided dose on as-needed basis. Discontinue omeprazole and ranitidine. Begin pantoprazole 40 mg by mouth 30 minutes before breakfast and evening meal daily. Patient will go to the lab for CBC, comprehensive chemistry panel and serum lipase. Upper abdominal ultrasound to be scheduled as soon as possible. Ultrasound and lab studies are unremarkable will proceed with diagnostic EGD.

## 2018-03-13 NOTE — Patient Instructions (Signed)
Physician will call with results of ultrasound and blood work and make further recommendations. Can take Tylenol up to 2 g/day on as-needed basis for knee pain.

## 2018-03-14 ENCOUNTER — Ambulatory Visit (HOSPITAL_COMMUNITY)
Admission: RE | Admit: 2018-03-14 | Discharge: 2018-03-14 | Disposition: A | Discharge status: Home / Self Care | Payer: Medicare Other | Source: Ambulatory Visit | Attending: Internal Medicine | Admitting: Internal Medicine

## 2018-03-14 DIAGNOSIS — R1010 Upper abdominal pain, unspecified: Secondary | ICD-10-CM | POA: Diagnosis present

## 2018-03-14 DIAGNOSIS — Z9049 Acquired absence of other specified parts of digestive tract: Secondary | ICD-10-CM | POA: Insufficient documentation

## 2018-03-14 DIAGNOSIS — K838 Other specified diseases of biliary tract: Secondary | ICD-10-CM | POA: Insufficient documentation

## 2018-03-14 LAB — CBC
HCT: 42.4 % (ref 38.5–50.0)
HEMOGLOBIN: 14.7 g/dL (ref 13.2–17.1)
MCH: 30.9 pg (ref 27.0–33.0)
MCHC: 34.7 g/dL (ref 32.0–36.0)
MCV: 89.3 fL (ref 80.0–100.0)
MPV: 9.4 fL (ref 7.5–12.5)
Platelets: 286 10*3/uL (ref 140–400)
RBC: 4.75 10*6/uL (ref 4.20–5.80)
RDW: 12.1 % (ref 11.0–15.0)
WBC: 5.9 10*3/uL (ref 3.8–10.8)

## 2018-03-14 LAB — COMPREHENSIVE METABOLIC PANEL
AG RATIO: 2 (calc) (ref 1.0–2.5)
ALT: 28 U/L (ref 9–46)
AST: 18 U/L (ref 10–35)
Albumin: 4.5 g/dL (ref 3.6–5.1)
Alkaline phosphatase (APISO): 56 U/L (ref 40–115)
BUN: 13 mg/dL (ref 7–25)
CO2: 27 mmol/L (ref 20–32)
Calcium: 9.5 mg/dL (ref 8.6–10.3)
Chloride: 101 mmol/L (ref 98–110)
Creat: 0.84 mg/dL (ref 0.70–1.25)
Globulin: 2.3 g/dL (calc) (ref 1.9–3.7)
Glucose, Bld: 110 mg/dL (ref 65–139)
Potassium: 4.4 mmol/L (ref 3.5–5.3)
SODIUM: 135 mmol/L (ref 135–146)
Total Bilirubin: 0.6 mg/dL (ref 0.2–1.2)
Total Protein: 6.8 g/dL (ref 6.1–8.1)

## 2018-03-14 LAB — LIPASE: Lipase: 16 U/L (ref 7–60)

## 2018-03-15 ENCOUNTER — Other Ambulatory Visit (INDEPENDENT_AMBULATORY_CARE_PROVIDER_SITE_OTHER): Payer: Self-pay | Admitting: *Deleted

## 2018-03-15 DIAGNOSIS — R1010 Upper abdominal pain, unspecified: Secondary | ICD-10-CM

## 2018-03-15 DIAGNOSIS — R110 Nausea: Secondary | ICD-10-CM | POA: Insufficient documentation

## 2018-03-20 ENCOUNTER — Encounter (HOSPITAL_COMMUNITY)
Admission: RE | Disposition: A | Discharge status: Home / Self Care | Payer: Self-pay | Source: Ambulatory Visit | Attending: Internal Medicine

## 2018-03-20 ENCOUNTER — Ambulatory Visit (HOSPITAL_COMMUNITY)
Admission: RE | Admit: 2018-03-20 | Discharge: 2018-03-20 | Disposition: A | Discharge status: Home / Self Care | Payer: Medicare Other | Source: Ambulatory Visit | Attending: Internal Medicine | Admitting: Internal Medicine

## 2018-03-20 ENCOUNTER — Encounter (HOSPITAL_COMMUNITY): Payer: Self-pay | Admitting: *Deleted

## 2018-03-20 ENCOUNTER — Other Ambulatory Visit: Payer: Self-pay

## 2018-03-20 DIAGNOSIS — Z880 Allergy status to penicillin: Secondary | ICD-10-CM | POA: Insufficient documentation

## 2018-03-20 DIAGNOSIS — Z809 Family history of malignant neoplasm, unspecified: Secondary | ICD-10-CM | POA: Diagnosis not present

## 2018-03-20 DIAGNOSIS — Z888 Allergy status to other drugs, medicaments and biological substances status: Secondary | ICD-10-CM | POA: Diagnosis not present

## 2018-03-20 DIAGNOSIS — Z833 Family history of diabetes mellitus: Secondary | ICD-10-CM | POA: Insufficient documentation

## 2018-03-20 DIAGNOSIS — K317 Polyp of stomach and duodenum: Secondary | ICD-10-CM | POA: Diagnosis not present

## 2018-03-20 DIAGNOSIS — R110 Nausea: Secondary | ICD-10-CM | POA: Insufficient documentation

## 2018-03-20 DIAGNOSIS — Z8249 Family history of ischemic heart disease and other diseases of the circulatory system: Secondary | ICD-10-CM | POA: Diagnosis not present

## 2018-03-20 DIAGNOSIS — I10 Essential (primary) hypertension: Secondary | ICD-10-CM | POA: Diagnosis not present

## 2018-03-20 DIAGNOSIS — R1013 Epigastric pain: Secondary | ICD-10-CM | POA: Diagnosis not present

## 2018-03-20 DIAGNOSIS — K219 Gastro-esophageal reflux disease without esophagitis: Secondary | ICD-10-CM | POA: Diagnosis not present

## 2018-03-20 DIAGNOSIS — Z79899 Other long term (current) drug therapy: Secondary | ICD-10-CM | POA: Insufficient documentation

## 2018-03-20 DIAGNOSIS — R1010 Upper abdominal pain, unspecified: Secondary | ICD-10-CM | POA: Insufficient documentation

## 2018-03-20 DIAGNOSIS — Z9049 Acquired absence of other specified parts of digestive tract: Secondary | ICD-10-CM | POA: Insufficient documentation

## 2018-03-20 DIAGNOSIS — Z8612 Personal history of poliomyelitis: Secondary | ICD-10-CM | POA: Insufficient documentation

## 2018-03-20 DIAGNOSIS — K449 Diaphragmatic hernia without obstruction or gangrene: Secondary | ICD-10-CM

## 2018-03-20 DIAGNOSIS — K228 Other specified diseases of esophagus: Secondary | ICD-10-CM

## 2018-03-20 HISTORY — PX: ESOPHAGOGASTRODUODENOSCOPY: SHX5428

## 2018-03-20 HISTORY — PX: POLYPECTOMY: SHX5525

## 2018-03-20 HISTORY — PX: BIOPSY: SHX5522

## 2018-03-20 SURGERY — EGD (ESOPHAGOGASTRODUODENOSCOPY)
Anesthesia: Moderate Sedation

## 2018-03-20 MED ORDER — MIDAZOLAM HCL 5 MG/5ML IJ SOLN
INTRAMUSCULAR | Status: AC
  Filled 2018-03-20: qty 10

## 2018-03-20 MED ORDER — SODIUM CHLORIDE 0.9 % IV SOLN
INTRAVENOUS | Status: DC
  Administered 2018-03-20: 14:00:00 via INTRAVENOUS

## 2018-03-20 MED ORDER — MEPERIDINE HCL 50 MG/ML IJ SOLN
INTRAMUSCULAR | Status: AC
  Filled 2018-03-20: qty 1

## 2018-03-20 MED ORDER — STERILE WATER FOR IRRIGATION IR SOLN
Status: DC | PRN
  Administered 2018-03-20: 1.5 mL

## 2018-03-20 MED ORDER — LIDOCAINE VISCOUS HCL 2 % MT SOLN
OROMUCOSAL | Status: AC
  Filled 2018-03-20: qty 15

## 2018-03-20 MED ORDER — MIDAZOLAM HCL 5 MG/5ML IJ SOLN
INTRAMUSCULAR | Status: DC | PRN
  Administered 2018-03-20 (×3): 2 mg via INTRAVENOUS
  Administered 2018-03-20: 1 mg via INTRAVENOUS

## 2018-03-20 MED ORDER — LIDOCAINE VISCOUS HCL 2 % MT SOLN
OROMUCOSAL | Status: DC | PRN
  Administered 2018-03-20: 1 via OROMUCOSAL

## 2018-03-20 MED ORDER — MEPERIDINE HCL 50 MG/ML IJ SOLN
INTRAMUSCULAR | Status: DC | PRN
  Administered 2018-03-20 (×2): 25 mg via INTRAVENOUS

## 2018-03-20 NOTE — Op Note (Signed)
[facility] Patient Name: Christopher Aguilar Procedure Date: 03/20/2018 2:23 PM MRN: [id number] Date of Birth: June 12, 1951 Attending MD: Lionel December , MD CSN: [id number] Age: 67 Admit Type: Outpatient Procedure:                Upper GI endoscopy Indications:              Epigastric abdominal pain Providers:                Lionel December, MD, Buel Ream. Thomasena Edis RN, RN, Edythe Clarity, Technician Referring MD:             Angela Cox, MD Medicines:                Lidocaine spray, Meperidine 50 mg IV, Midazolam 7                            mg IV Complications:            No immediate complications. Estimated Blood Loss:     Estimated blood loss was minimal. Procedure:                Pre-Anesthesia Assessment:                           - Prior to the procedure, a History and Physical                            was performed, and patient medications and                            allergies were reviewed. The patient's tolerance of                            previous anesthesia was also reviewed. The risks                            and benefits of the procedure and the sedation                            options and risks were discussed with the patient.                            All questions were answered, and informed consent                            was obtained. Prior Anticoagulants: The patient                            last took naproxen 7 days prior to the procedure.                            ASA Grade Assessment: II - A patient with mild  systemic disease. After reviewing the risks and                            benefits, the patient was deemed in satisfactory                            condition to undergo the procedure.                           After obtaining informed consent, the endoscope was                            passed under direct vision. Throughout the                            procedure, the patient's blood  pressure, pulse, and                            oxygen saturations were monitored continuously. The                            [id number]-[id number] ([id number]) scope was introduced through the                            mouth, and advanced to the second part of duodenum.                            The upper GI endoscopy was accomplished without                            difficulty. The patient tolerated the procedure                            well. Scope In: 2:53:28 PM Scope Out: 3:05:59 PM Total Procedure Duration: 0 hours 12 minutes 31 seconds  Findings:      The proximal esophagus and mid esophagus were normal.      There were esophageal mucosal changes suspicious for short-segment       Barrett's esophagus present in the distal esophagus. The maximum       longitudinal extent of these mucosal changes was 15 mm in length. Mucosa       was biopsied with a cold forceps for histology randomly. One specimen       bottle was sent to pathology. The pathology specimen was placed into       Bottle Number 1.      The Z-line was irregular and was found 34 cm from the incisors.      A 3 cm hiatal hernia was present.      A few small sessile polyps were found in the gastric fundus and in the       gastric body. Biopsies were taken with a cold forceps for histology. The       pathology specimen was placed into Bottle Number 2.      The exam of the stomach was otherwise normal.      The duodenal bulb and second portion of the duodenum  were normal. Impression:               - Normal proximal esophagus and mid esophagus.                           - Esophageal mucosal changes suspicious for                            short-segment Barrett's esophagus. Biopsied.                           - Z-line irregular, 34 cm from the incisors.                           - 3 cm hiatal hernia.                           - A few gastric polyps. Biopsied.                           - Normal duodenal bulb and second portion of the                             duodenum. Moderate Sedation:      Moderate (conscious) sedation was administered by the endoscopy nurse       and supervised by the endoscopist. The following parameters were       monitored: oxygen saturation, heart rate, blood pressure, CO2       capnography and response to care. Total physician intraservice time was       20 minutes. Recommendation:           - Patient has a contact number available for                            emergencies. The signs and symptoms of potential                            delayed complications were discussed with the                            patient. Return to normal activities tomorrow.                            Written discharge instructions were provided to the                            patient.                           - Resume previous diet today.                           - Continue present medications.                           - Await pathology results. Procedure Code(s):        --- Professional ---  21308, Esophagogastroduodenoscopy, flexible,                            transoral; with biopsy, single or multiple                           G0500, Moderate sedation services provided by the                            same physician or other qualified health care                            professional performing a gastrointestinal                            endoscopic service that sedation supports,                            requiring the presence of an independent trained                            observer to assist in the monitoring of the                            patient's level of consciousness and physiological                            status; initial 15 minutes of intra-service time;                            patient age 20 years or older (additional time may                            be reported with 65784, as appropriate) Diagnosis Code(s):        --- Professional ---                            K22.8, Other specified diseases of esophagus                           K44.9, Diaphragmatic hernia without obstruction or                            gangrene                           K31.7, Polyp of stomach and duodenum                           R10.13, Epigastric pain CPT copyright 2017 American Medical Association. All rights reserved. The codes documented in this report are preliminary and upon coder review may  be revised to meet current compliance requirements. Lionel December, MD Lionel December, MD 03/20/2018 3:19:22 PM This report has been signed electronically. Number of Addenda: 0

## 2018-03-20 NOTE — H&P (Signed)
Christopher Aguilar is an 67 y.o. male.   Chief Complaint: Patient is here for EGD. HPI: She is a 67 year old Caucasian male who was seen in the office last week for epigastric pain associated with nausea unresponsive therapy.  Lab studies were unremarkable.  Ultrasound similarly was negative for biliary or pancreatic abnormality.  Aleve was discontinued.  He is feeling better.  He is under quite diagnostic EGD looking for peptic ulcer disease. Please see my consultation note from 1 week ago for rest of the details.  Past Medical History:  Diagnosis Date  . GERD (gastroesophageal reflux disease)   . Hypertension   . Polio    as a child, has a very mild limp.    Past Surgical History:  Procedure Laterality Date  . CHOLECYSTECTOMY    . GALLBLADDER SURGERY  03/2015  . OPEN REDUCTION INTERNAL FIXATION (ORIF) DISTAL RADIAL FRACTURE Left 08/23/2016   Procedure: OPEN REDUCTION INTERNAL FIXATION (ORIF) LEFT DISTAL RADIUS FRACTURE;  Surgeon: Tarry Kos, MD;  Location: [facility];  Service: Orthopedics;  Laterality: Left;    Family History  Problem Relation Age of Onset  . Diabetes Mother   . Hypertension Mother   . Cancer Sister   . Diabetes Brother   . Hypertension Brother    Social History:  reports that he has never smoked. He has never used smokeless tobacco. He reports that he does not drink alcohol or use drugs.  Allergies:  Allergies  Allergen Reactions  . Lipitor [Atorvastatin]     This statin and all statins patient is intolerant to. Causes increase in Liver Enzymes.  . Penicillins Hives    Has patient had a PCN reaction causing immediate rash, facial/tongue/throat swelling, SOB or lightheadedness with hypotension: Yes Has patient had a PCN reaction causing severe rash involving mucus membranes or skin necrosis: No Has patient had a PCN reaction that required hospitalization No Has patient had a PCN reaction occurring within the last 10 years: No If all of the  above answers are "NO", then may proceed with Cephalosporin use.     Medications Prior to Admission  Medication Sig Dispense Refill  . Ascorbic Acid (VITAMIN C) 1000 MG tablet Take 1,000 mg by mouth daily.    Marland Kitchen Dextrose-Fructose-Sod Citrate (NAUZENE PO) Take 1 tablet by mouth daily as needed (nausea).    . docusate sodium (COLACE) 100 MG capsule Take 100 mg by mouth 2 (two) times daily.    Marland Kitchen docusate sodium (EQUATE STOOL SOFTENER) 100 MG capsule Take 100 mg by mouth 2 (two) times daily.    Marland Kitchen lisinopril (PRINIVIL,ZESTRIL) 20 MG tablet Take 20 mg by mouth daily.    . Multiple Vitamin (MULTIVITAMIN WITH MINERALS) TABS tablet Take 1 tablet by mouth daily.    Marland Kitchen OVER THE COUNTER MEDICATION as needed. Patient states this is the Equate brand of laxative.    . pantoprazole (PROTONIX) 40 MG tablet Take 1 tablet (40 mg total) by mouth 2 (two) times daily before a meal. 60 tablet 2  . polyethylene glycol (MIRALAX / GLYCOLAX) packet Take 17 g by mouth daily.    . Probiotic Product (PHILLIPS COLON HEALTH PO) Take 1 capsule by mouth daily.    . Sennosides (EQ LAXATIVE PO) Take 1 tablet by mouth daily as needed (constipation).    . sodium phosphate (FLEET) 7-19 GM/118ML ENEM Place 1 enema rectally daily as needed for severe constipation.       No results found for this or any previous  visit (from the past 48 hour(s)). No results found.  ROS  Blood pressure 131/69, pulse 71, temperature 98.8 F (37.1 C), temperature source Oral, resp. rate 16, SpO2 100 %. Physical Exam  Constitutional: He appears well-developed and well-nourished.  HENT:  Mouth/Throat: Oropharynx is clear and moist.  Eyes: Conjunctivae are normal. No scleral icterus.  Neck: No thyromegaly present.  Cardiovascular: Normal rate, regular rhythm and normal heart sounds.  No murmur heard. Respiratory: Effort normal and breath sounds normal.  GI: Soft. He exhibits no distension and no mass. There is no tenderness.  Musculoskeletal:   Muscle wasting to left lower extremity  Lymphadenopathy:    He has no cervical adenopathy.  Neurological: He is alert.  Skin: Skin is warm and dry.     Assessment/Plan Epigastric pain and nausea. Agnostic EGD.  Christopher DecemberNajeeb Radek Carnero, MD 03/20/2018, 2:42 PM

## 2018-03-20 NOTE — Discharge Instructions (Signed)
No aspirin or NSAIDs for 24 hours. Resume medications as before. Resume usual diet. No driving for 24 hours. Patient will call with biopsy results.      Esophagogastroduodenoscopy, Care After Refer to this sheet in the next few weeks. These instructions provide you with information about caring for yourself after your procedure. Your health care provider may also give you more specific instructions. Your treatment has been planned according to current medical practices, but problems sometimes occur. Call your health care provider if you have any problems or questions after your procedure. What can I expect after the procedure? After the procedure, it is common to have:  A sore throat.  Nausea.  Bloating.  Dizziness.  Fatigue.  Follow these instructions at home:  Do not eat or drink anything until the numbing medicine (local anesthetic) has worn off and your gag reflex has returned. You will know that the local anesthetic has worn off when you can swallow comfortably.  Do not drive for 24 hours if you received a medicine to help you relax (sedative).  If your health care provider took a tissue sample for testing during the procedure, make sure to get your test results. This is your responsibility. Ask your health care provider or the department performing the test when your results will be ready.  Keep all follow-up visits as told by your health care provider. This is important. Contact a health care provider if:  You cannot stop coughing.  You are not urinating.  You are urinating less than usual. Get help right away if:  You have trouble swallowing.  You cannot eat or drink.  You have throat or chest pain that gets worse.  You are dizzy or light-headed.  You faint.  You have nausea or vomiting.  You have chills.  You have a fever.  You have severe abdominal pain.  You have black, tarry, or bloody stools. This information is not intended to replace advice  given to you by your health care provider. Make sure you discuss any questions you have with your health care provider. Document Released: 06/26/2012 Document Revised: 12/16/2015 Document Reviewed: 06/03/2015 Elsevier Interactive Patient Education  [employer]2018 [employer].

## 2018-03-22 ENCOUNTER — Encounter (HOSPITAL_COMMUNITY): Payer: Self-pay | Admitting: Internal Medicine

## 2018-03-26 ENCOUNTER — Telehealth (INDEPENDENT_AMBULATORY_CARE_PROVIDER_SITE_OTHER): Payer: Self-pay | Admitting: Internal Medicine

## 2018-03-26 NOTE — Telephone Encounter (Signed)
Patient came by office - stated you could get in touch with him on his cell phone #[phone]

## 2018-05-28 ENCOUNTER — Encounter (INDEPENDENT_AMBULATORY_CARE_PROVIDER_SITE_OTHER): Payer: Self-pay | Admitting: Internal Medicine

## 2018-05-28 ENCOUNTER — Ambulatory Visit (INDEPENDENT_AMBULATORY_CARE_PROVIDER_SITE_OTHER): Payer: Medicare Other | Admitting: Internal Medicine

## 2018-05-28 VITALS — BP 120/82 | HR 74 | Temp 98.3°F | Resp 18 | Ht 68.0 in | Wt 182.6 lb

## 2018-05-28 DIAGNOSIS — R634 Abnormal weight loss: Secondary | ICD-10-CM | POA: Diagnosis not present

## 2018-05-28 DIAGNOSIS — R1010 Upper abdominal pain, unspecified: Secondary | ICD-10-CM | POA: Diagnosis not present

## 2018-05-28 DIAGNOSIS — K2100 Gastro-esophageal reflux disease with esophagitis, without bleeding: Secondary | ICD-10-CM

## 2018-05-28 MED ORDER — PANTOPRAZOLE SODIUM 40 MG PO TBEC: 40.0000 mg | DELAYED_RELEASE_TABLET | Freq: Every day | ORAL | 3 refills | Status: DC

## 2018-05-28 MED ORDER — FAMOTIDINE 20 MG PO TABS: 20.0000 mg | ORAL_TABLET | Freq: Every evening | ORAL | Status: DC | PRN

## 2018-05-28 NOTE — Patient Instructions (Addendum)
Call if symptoms relapse with decrease in pantoprazole dose. Can try lesser dose of polyethylene glycol.  Use 8.5 g to 17 g daily as needed.

## 2018-05-28 NOTE — Progress Notes (Signed)
Presenting complaint;  Follow-up for abdominal pain GERD and weight loss.  Database and subjective:  Patient is 67 year old Caucasian male who was last seen on 03/13/2018 for upper abdominal pain nausea weight loss and constipation.  His constipation was felt to be due to diminished oral intake.  He had lost 7 pounds prior to that visit.  CBC, comprehensive chemistry panel and serum lipase were normal.  Ultrasound revealed mildly dilated bile duct normal for postcholecystectomy state.  EGD revealed single patch of gastric type mucosa distal esophagus small sliding hiatal hernia and gastric polyps.  Polyp biopsy revealed fundic gland polyp and biopsy from distal esophagus revealed changes of reflux esophagitis but no Barrett's. PPI was switched to pantoprazole and he was begun on polyethylene glycol.  He feels much better.  He has not taken OTC nausea medication and several weeks.  He denies heartburn dysphagia or vomiting.  He had 2 episodes of mild epigastric pain with nausea after he ate stew.  These episodes are mild and he was better by the next day.  His bowels are moving daily.  He has not taken laxative recently.  He is on a stool softener and polyethylene glycol.  He complains of flatulence which is worse when he wakes up in the morning but he is able to expel gas and feels better.  He is using Mylicon and as-needed basis. He had a screening colonoscopy in April 2019 at [facility].  He uses a stationary bike once or twice a week.  He does do yard work but unable to walk because of left leg weakness due to polio.  Current Medications: Outpatient Encounter Medications as of 05/28/2018  Medication Sig  . Ascorbic Acid (VITAMIN C) 1000 MG tablet Take 1,000 mg by mouth daily.  Marland Kitchen Dextrose-Fructose-Sod Citrate (NAUZENE PO) Take 1 tablet by mouth daily as needed (nausea).  . docusate sodium (EQUATE STOOL SOFTENER) 100 MG capsule Take 100 mg by mouth 2 (two) times daily.  Marland Kitchen ibuprofen  (ADVIL,MOTRIN) 200 MG tablet Take 200 mg by mouth every evening.  Marland Kitchen lisinopril (PRINIVIL,ZESTRIL) 20 MG tablet Take 20 mg by mouth daily.  . Multiple Vitamin (MULTIVITAMIN WITH MINERALS) TABS tablet Take 1 tablet by mouth daily.  Marland Kitchen OVER THE COUNTER MEDICATION as needed. Patient states this is the Equate brand of laxative.  . pantoprazole (PROTONIX) 40 MG tablet Take 1 tablet (40 mg total) by mouth 2 (two) times daily before a meal.  . polyethylene glycol (MIRALAX / GLYCOLAX) packet Take 17 g by mouth daily.  . Probiotic Product (PHILLIPS COLON HEALTH PO) Take 1 capsule by mouth daily.  . Sennosides (EQ LAXATIVE PO) Take 1 tablet by mouth daily as needed (constipation).  . simethicone (MYLICON) 125 MG chewable tablet Chew 125 mg by mouth as needed for flatulence.  . [DISCONTINUED] sodium phosphate (FLEET) 7-19 GM/118ML ENEM Place 1 enema rectally daily as needed for severe constipation.    No facility-administered encounter medications on file as of 05/28/2018.      Objective: Blood pressure 120/82, pulse 74, temperature 98.3 F (36.8 C), temperature source Oral, resp. rate 18, height 5\' 8"  (1.727 m), weight 182 lb 9.6 oz (82.8 kg). Patient is alert and in no acute distress. Conjunctiva is pink. Sclera is nonicteric Oropharyngeal mucosa is normal. No neck masses or thyromegaly noted. Cardiac exam with regular rhythm normal S1 and S2. No murmur or gallop noted. Lungs are clear to auscultation. Abdomen is symmetrical and soft.  Percussion note is tympanitic.  On palpation  abdomen is soft and nontender with organomegaly or masses. No LE edema or clubbing noted. He has marked atrophy of left hamstrings and quadriceps and some atrophy of left calf muscles.  Assessment:  #1.  Epigastric/upper abdominal pain.  He only had 2 mild episodes after he ate stool.  Pain possibly triggered by niacin.  EGD was negative for peptic ulcer disease or H. pylori gastritis.  He has some flatulence but  medication is helping.  #2.  GERD.  EGD on 03/20/2018 revealed a single tongue of gastric type mucosa but biopsy was negative for Barrett's.  She also had small sliding hiatal hernia.  Symptoms are well controlled with double dose PPI.  He is ready for dose reduction.  #3.  Weight loss.  Weight loss has reversed and then some.  On his last visit of 03/13/2018 he had lost 7 pounds and now he has gained 12 pounds.  #4.  He is up-to-date on screening for CRC.  Last colonoscopy was by  Dr. Marcha Solders in April 2019.  Family history is positive for rectal adenocarcinoma in his sister but she was close to 50.  Patient could wait longer than 5 years.   Plan:  Decrease pantoprazole to 40 mg p.o. every morning. Pepcid OTC 20 mg p.o. nightly as needed. Can try lesser dose of polyethylene glycol. Patient will call if flatulence becomes intractable. Office visit in 1 year.

## 2018-10-08 ENCOUNTER — Telehealth (INDEPENDENT_AMBULATORY_CARE_PROVIDER_SITE_OTHER): Payer: Self-pay | Admitting: Internal Medicine

## 2018-10-08 NOTE — Telephone Encounter (Signed)
Patient called stated his PCP has put him on cholesterol medication - would like to discuss this as to how it might effect his other medication - ph# [phone]

## 2018-10-09 NOTE — Telephone Encounter (Signed)
Patient went to the PCP and had lab work.  Cholesterol ect were elevated so the PCP put him on Fenofibrate . Patient has not started this medication as he is concerned of the side effects, recently had trouble with Lipitor. He says that this medication has acid, he would really like Dr.Rehman's input before he starts the medication. He is wanting to diet , exercise to see if this will help blood levels come down.

## 2018-10-11 NOTE — Telephone Encounter (Signed)
Per Dr.Rehman - he has no objection with the patient taking this medication. This medication is less than the other statins. The exercise will help bringing down elevated levels 10%- 15%. Not as fast as the Fenofibrate would help.  Patient was called and made aware.

## 2019-06-03 ENCOUNTER — Ambulatory Visit (INDEPENDENT_AMBULATORY_CARE_PROVIDER_SITE_OTHER): Payer: Self-pay | Admitting: Internal Medicine

## 2019-06-22 ENCOUNTER — Other Ambulatory Visit (INDEPENDENT_AMBULATORY_CARE_PROVIDER_SITE_OTHER): Payer: Self-pay | Admitting: Internal Medicine

## 2019-09-23 ENCOUNTER — Encounter (INDEPENDENT_AMBULATORY_CARE_PROVIDER_SITE_OTHER): Payer: Self-pay | Admitting: Internal Medicine

## 2019-09-23 ENCOUNTER — Ambulatory Visit (INDEPENDENT_AMBULATORY_CARE_PROVIDER_SITE_OTHER): Payer: Medicare Other | Admitting: Internal Medicine

## 2019-09-23 ENCOUNTER — Other Ambulatory Visit: Payer: Self-pay

## 2019-09-23 DIAGNOSIS — K219 Gastro-esophageal reflux disease without esophagitis: Secondary | ICD-10-CM

## 2019-09-23 DIAGNOSIS — K5900 Constipation, unspecified: Secondary | ICD-10-CM

## 2019-09-23 DIAGNOSIS — K5909 Other constipation: Secondary | ICD-10-CM | POA: Insufficient documentation

## 2019-09-23 NOTE — Progress Notes (Signed)
Presenting complaint;  Follow-up of GERD.  History of weight loss.  Database and subjective:  Patient is 69 year old Caucasian male who is here for scheduled visit.  He has history of upper abdominal pain GERD and weight loss.  He had work-up in 2019.  He had esophagogastroduodenoscopy in August 2019 and was negative for peptic ulcer disease.  He also underwent abdominal ultrasound which revealed no evidence of choledocholithiasis or dilated bile duct.  He responded to therapy for GERD.  He was doing well on his last visit of 05/28/2018.  He is up-to-date on screening for CRC.  Last colonoscopy was in April 2019 by Dr. Marcha Solders of [facility].  He continues to feel well.  He has breakthrough heartburn occasionally.  He may take Tums 1-2 times in a month.  He has bowels are regular like clockwork.  He states he drinks a cup of coffee in the morning and few minutes later he has a bowel movement.  He denies nausea vomiting dysphagia or abdominal pain.  He does not take ibuprofen very often. His weight today is 188 pounds but he is also wearing left leg/thigh brace.  He weighed 180 pounds in his last visit. Patient states he saw Dr. Case Dr. Lequita Halt for left knee pain.  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  Because of muscular atrophy due to polio knee replacement was not advised.  He was fitted with brace in May 2020.  He feels with use of this brace he is able to do more physical activity had he feels quality of his life is improved significantly.  Current Medications: Outpatient Encounter Medications as of 09/23/2019  Medication Sig  . Ascorbic Acid (VITAMIN C) 1000 MG tablet Take 1,000 mg by mouth daily.  . Calcium Carbonate Antacid (TUMS PO) Take by mouth as needed.  . Cyanocobalamin (VITAMIN B12 PO) Take 1,000 mg by mouth daily.  Marland Kitchen docusate sodium (EQUATE STOOL SOFTENER) 100 MG capsule Take 100 mg by mouth 2 (two) times daily.  Marland Kitchen FIBER SELECT GUMMIES PO Take by mouth. Patient takes 3 at  bedtime  . ibuprofen (ADVIL,MOTRIN) 200 MG tablet Take 200 mg by mouth every evening.  Marland Kitchen lisinopril (PRINIVIL,ZESTRIL) 20 MG tablet Take 20 mg by mouth daily.  . Melatonin 1 MG CAPS Take by mouth at bedtime.  . Multiple Vitamin (MULTIVITAMIN WITH MINERALS) TABS tablet Take 1 tablet by mouth daily.  . pantoprazole (PROTONIX) 40 MG tablet TAKE 1 TABLET BY MOUTH EVERY DAY BEFORE BREAKFAST  . polyethylene glycol (MIRALAX / GLYCOLAX) packet Take 17 g by mouth daily. Patient takes 1/2 cap of the Clearlax  . Probiotic Product (PHILLIPS COLON HEALTH PO) Take 1 capsule by mouth daily.  . ranitidine (ZANTAC) 150 MG capsule Take 150 mg by mouth daily. Patient takes daily before a meal  . Sennosides (EQ LAXATIVE PO) Take 1 tablet by mouth daily as needed (constipation).  . simethicone (MYLICON) 125 MG chewable tablet Chew 125 mg by mouth as needed for flatulence.  Marland Kitchen VITAMIN D PO Take 2,000 Units by mouth. This is Vitamin D 3.  . [DISCONTINUED] Dextrose-Fructose-Sod Citrate (NAUZENE PO) Take 1 tablet by mouth daily as needed (nausea).  . [DISCONTINUED] famotidine (PEPCID) 20 MG tablet Take 1 tablet (20 mg total) by mouth at bedtime as needed for heartburn or indigestion. (Patient not taking: Reported on 09/23/2019)   No facility-administered encounter medications on file as of 09/23/2019.     Objective: Blood pressure (!) 160/84, pulse 76, temperature 98.1 F (  36.7 C), temperature source Temporal, height 5\' 8"  (1.727 m), weight 188 lb 9.6 oz (85.5 kg). Patient is alert and in no acute distress. He is wearing facial mask. Conjunctiva is pink. Sclera is nonicteric Oropharyngeal mucosa is normal. No neck masses or thyromegaly noted. Cardiac exam with regular rhythm normal S1 and S2. No murmur or gallop noted. Lungs are clear to auscultation. Abdomen is symmetrical soft and nontender with no organomegaly no masses No LE edema or clubbing noted.  Labs/studies Results:  CBC Latest Ref Rng & Units  03/13/2018  WBC 3.8 - 10.8 Thousand/uL 5.9  Hemoglobin 13.2 - 17.1 g/dL 14.7  Hematocrit 38.5 - 50.0 % 42.4  Platelets 140 - 400 Thousand/uL 286    CMP Latest Ref Rng & Units 03/13/2018  Glucose 65 - 139 mg/dL 110  BUN 7 - 25 mg/dL 13  Creatinine 0.70 - 1.25 mg/dL 0.84  Sodium 135 - 146 mmol/L 135  Potassium 3.5 - 5.3 mmol/L 4.4  Chloride 98 - 110 mmol/L 101  CO2 20 - 32 mmol/L 27  Calcium 8.6 - 10.3 mg/dL 9.5  Total Protein 6.1 - 8.1 g/dL 6.8  Total Bilirubin 0.2 - 1.2 mg/dL 0.6  AST 10 - 35 U/L 18  ALT 9 - 46 U/L 28    Hepatic Function Latest Ref Rng & Units 03/13/2018  Total Protein 6.1 - 8.1 g/dL 6.8  AST 10 - 35 U/L 18  ALT 9 - 46 U/L 28  Total Bilirubin 0.2 - 1.2 mg/dL 0.6     Assessment:  #1.  Chronic GERD.  Patient is doing well with therapy.  It may be the time for him to try PPI every other day and see how it goes.  #2.  Constipation.  Patient is doing well with dietary measures stool softener and polyethylene glycol.  #3.  History of weight loss.  He has gained at least 12 pounds in the last 18 months.  Change in his weight since last visit may be due to the fact that he use leg brace.  Plan:  Medication list updated.  Ranitidine daily to. Patient will try pantoprazole 40 mg every other day.  He can take famotidine OTC on off days if he needs to. Will not make any changes to pantoprazole prescription for now. Office visit in 1 year.

## 2019-09-23 NOTE — Patient Instructions (Signed)
Can try pantoprazole every other day.  If it does not work can go back to daily schedule. Can take famotidine/Pepcid OTC 20 mg on as-needed basis or on off days.

## 2019-09-27 ENCOUNTER — Ambulatory Visit: Payer: Medicare PPO | Attending: Internal Medicine

## 2019-09-27 DIAGNOSIS — Z23 Encounter for immunization: Secondary | ICD-10-CM | POA: Insufficient documentation

## 2019-09-27 NOTE — Progress Notes (Signed)
   Covid-19 Vaccination Clinic  Name:  JIHAD BROWNLOW    MRN: [id number] DOB: [birth year]  09/27/2019  Mr. Lumley was observed post Covid-19 immunization for 15 minutes without incident. He was provided with Vaccine Information Sheet and instruction to access the V-Safe system.   Mr. Gabay was instructed to call 911 with any severe reactions post vaccine: Marland Kitchen Difficulty breathing  . Swelling of face and throat  . A fast heartbeat  . A bad rash all over body  . Dizziness and weakness   Immunizations Administered    Name Date Dose VIS Date Route   Moderna COVID-19 Vaccine 09/27/2019  8:59 AM 0.5 mL 06/24/2019 Intramuscular   Manufacturer: Moderna   Lot: [id number]   NDC: 95320-233-43

## 2019-10-29 ENCOUNTER — Ambulatory Visit: Payer: Medicare PPO | Attending: Internal Medicine

## 2019-10-29 DIAGNOSIS — Z23 Encounter for immunization: Secondary | ICD-10-CM

## 2019-10-29 NOTE — Progress Notes (Signed)
   Covid-19 Vaccination Clinic  Name:  AMIAS HUTCHINSON    MRN: [id number] DOB: 23-May-1951  10/29/2019  Mr. Adames was observed post Covid-19 immunization for 15 minutes without incident. He was provided with Vaccine Information Sheet and instruction to access the V-Safe system.   Mr. Speros was instructed to call 911 with any severe reactions post vaccine: Marland Kitchen Difficulty breathing  . Swelling of face and throat  . A fast heartbeat  . A bad rash all over body  . Dizziness and weakness   Immunizations Administered    Name Date Dose VIS Date Route   Moderna COVID-19 Vaccine 10/29/2019  8:40 AM 0.5 mL 06/24/2019 Intramuscular   Manufacturer: Gala Murdoch   Lot: [id number]   NDC: 03496-116-43

## 2019-12-30 ENCOUNTER — Telehealth (INDEPENDENT_AMBULATORY_CARE_PROVIDER_SITE_OTHER): Payer: Self-pay | Admitting: *Deleted

## 2019-12-30 NOTE — Telephone Encounter (Signed)
A new Rx was called to [facility] for Pantoprazole 40 m g - patient will take 1 by mouth every other day.3 mouth supply 3 refills.  As noted in the OV from March 2021 - if the patient has any breakthrough sysmptoms he may take the Famotidine OTC . This was called to [facility].

## 2020-03-25 ENCOUNTER — Other Ambulatory Visit (INDEPENDENT_AMBULATORY_CARE_PROVIDER_SITE_OTHER): Payer: Self-pay | Admitting: Internal Medicine

## 2020-06-24 ENCOUNTER — Other Ambulatory Visit (INDEPENDENT_AMBULATORY_CARE_PROVIDER_SITE_OTHER): Payer: Self-pay | Admitting: Internal Medicine

## 2020-07-08 ENCOUNTER — Ambulatory Visit: Payer: Medicare PPO | Attending: Internal Medicine

## 2020-07-08 DIAGNOSIS — Z23 Encounter for immunization: Secondary | ICD-10-CM

## 2020-07-08 NOTE — Progress Notes (Signed)
   Covid-19 Vaccination Clinic  Name:  Christopher Aguilar    MRN: [id number] DOB: October 09, 1950  07/08/2020  Christopher Aguilar was observed post Covid-19 immunization for 15 minutes without incident. He was provided with Vaccine Information Sheet and instruction to access the V-Safe system.   Christopher Aguilar was instructed to call 911 with any severe reactions post vaccine: Marland Kitchen Difficulty breathing  . Swelling of face and throat  . A fast heartbeat  . A bad rash all over body  . Dizziness and weakness   Immunizations Administered    No immunizations on file.

## 2020-09-12 ENCOUNTER — Other Ambulatory Visit (INDEPENDENT_AMBULATORY_CARE_PROVIDER_SITE_OTHER): Payer: Self-pay | Admitting: Internal Medicine

## 2020-09-13 NOTE — Telephone Encounter (Signed)
Will refill medication for 3 months, needs follow up appointment with any provider in order to receive any refills. ? ?Thanks, ? ?Daniel Castaneda, MD ?Gastroenterology and Hepatology ?[facility] ? ?

## 2020-09-28 ENCOUNTER — Other Ambulatory Visit: Payer: Self-pay

## 2020-09-28 ENCOUNTER — Encounter (INDEPENDENT_AMBULATORY_CARE_PROVIDER_SITE_OTHER): Payer: Self-pay | Admitting: Internal Medicine

## 2020-09-28 ENCOUNTER — Ambulatory Visit (INDEPENDENT_AMBULATORY_CARE_PROVIDER_SITE_OTHER): Payer: Medicare PPO | Admitting: Internal Medicine

## 2020-09-28 VITALS — BP 150/76 | HR 66 | Temp 98.6°F | Ht 68.0 in | Wt 188.2 lb

## 2020-09-28 DIAGNOSIS — K2100 Gastro-esophageal reflux disease with esophagitis, without bleeding: Secondary | ICD-10-CM | POA: Diagnosis not present

## 2020-09-28 DIAGNOSIS — K5909 Other constipation: Secondary | ICD-10-CM

## 2020-09-28 MED ORDER — PANTOPRAZOLE SODIUM 40 MG PO TBEC: 40.0000 mg | DELAYED_RELEASE_TABLET | Freq: Every day | ORAL | 11 refills | Status: AC

## 2020-09-28 MED ORDER — LINACLOTIDE 145 MCG PO CAPS: 145.0000 ug | ORAL_CAPSULE | Freq: Every day | ORAL | 5 refills | Status: DC

## 2020-09-28 NOTE — Patient Instructions (Signed)
Discontinue polyethylene glycol and Senokot or sennosides. Begin Linzess/linaclotide 145 mcg by mouth every morning.  If this dose works can continue [employer]. Please call office with progress report in few weeks. Can take Tylenol up to 2 g/day as needed

## 2020-09-28 NOTE — Progress Notes (Signed)
Presenting complaint;  Follow for chronic GERD and constipation.  Database and subjective:  Patient is 70 year old Caucasian male who is here for scheduled visit.  He was last seen 1 year ago.  He underwent EGD in August 2019 for epigastric pain and GERD.  He was suspected to have short segment Barrett's esophagus but biopsy ruled out.  He also has small sliding hiatal hernia.  He also had abdominal ultrasound and was negative for choledocholithiasis or dilated bile duct.  He responded to PPI therapy. His last colonoscopy was in April 2019 by Dr. Marcha Solders of [facility] and was normal.  He states he is doing well as far as heartburn is concerned.  He has an episode every now and then.  He states he had an episode of abdominal pain on 09/19/2020 1 day after eating at a [employer].  He had burning across his upper abdomen and he had hot flashes.  He checked his temperature was 98.4.  He was fine the next day.  He did not experience nausea vomiting diarrhea melena or rectal bleeding. He complains of constipation.  He is not having to take stimulant laxative every 2 to 3 days.  2 weeks ago he had to use an enema.  Laxative is not working like it used to.  He is also on MiraLAX.  His appetite is good.  His weight has not changed in the last 1 year.  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   Current Medications: Outpatient Encounter Medications as of 09/28/2020  Medication Sig  . acetaminophen (TYLENOL 8 HOUR ARTHRITIS PAIN) 650 MG CR tablet Take 650 mg by mouth every 8 (eight) hours as needed for pain.  . Ascorbic Acid (VITAMIN C) 1000 MG tablet Take 1,000 mg by mouth daily.  . Calcium Carbonate Antacid (TUMS PO) Take by mouth as needed.  . Cyanocobalamin (VITAMIN B12 PO) Take 1,000 mg by mouth daily.  Marland Kitchen docusate sodium (COLACE) 100 MG capsule Take 100 mg by mouth 2 (two) times daily.  . Doxylamine Succinate, Sleep, (UNISOM PO) Take 50  mg by mouth at bedtime.  . famotidine (PEPCID) 10 MG tablet Take 10 mg by mouth at bedtime.  Marland Kitchen FIBER SELECT GUMMIES PO Take by mouth. Patient takes 3 at bedtime  . ibuprofen (ADVIL,MOTRIN) 200 MG tablet Take 200 mg by mouth every evening.  Marland Kitchen lisinopril (PRINIVIL,ZESTRIL) 20 MG tablet Take 20 mg by mouth daily.  Marland Kitchen loratadine (CLARITIN) 10 MG tablet Take 10 mg by mouth as needed for allergies.  . Melatonin 1 MG CAPS Take by mouth at bedtime.  . Multiple Vitamin (MULTIVITAMIN WITH MINERALS) TABS tablet Take 1 tablet by mouth daily.  . pantoprazole (PROTONIX) 40 MG tablet TAKE 1 TABLET BY MOUTH EVERY OTHER DAY (Patient taking differently: Take 40 mg by mouth daily.)  . polyethylene glycol (MIRALAX / GLYCOLAX) packet Take 17 g by mouth daily. Patient takes 1/2 cap of the Clearlax  . Probiotic Product (PHILLIPS COLON HEALTH PO) Take 1 capsule by mouth daily.  . SENNOSIDES PO Take 25 mg by mouth. Patient takes 1 by mouth in the morning and 1 at night if needed.  . simethicone (MYLICON) 125 MG chewable tablet Chew 125 mg by mouth as needed for flatulence.  Marland Kitchen VITAMIN D PO Take 2,000 Units by mouth. This is Vitamin D 3.   No facility-administered encounter medications on file as of 09/28/2020.    Baclofen  10 mg po bid prn   Objective: Blood pressure (!) 150/76, pulse 66, temperature 98.6 F (37 C), temperature source Oral, height 5\' 8"  (1.727 m), weight 188 lb 3.2 oz (85.4 kg). Patient is alert and in no acute distress. He is wearing a mask. Conjunctiva is pink. Sclera is nonicteric Oropharyngeal mucosa is normal. No neck masses or thyromegaly noted. Cardiac exam with regular rhythm normal S1 and S2. No murmur or gallop noted. Lungs are clear to auscultation. Abdomen is full but soft and nontender with organomegaly or masses. No LE edema or clubbing noted. He has left leg brace which extends from ankle/foot to thigh.  Assessment:  #1.  Chronic GERD.  Typical symptoms are well controlled.   Recent episode of burning epigastric pain appears to be triggered by food.  He has had similar episodes in the past.  EGD back in August 2019 did not reveal peptic ulcer disease or H. pylori infection. If he has more episodes may consider further evaluation.  #2.  Chronic constipation.  He is not getting relief high-fiber diet, stool softener and polyethylene glycol and having to use stimulant laxative more often but still not having satisfactory evacuation.  It is time to change to another medication.  He is up-to-date on CRC screening.  Last colonoscopy was in April 2019 as above.  Plan:  Discontinue polyethylene glycol and sennosides. Keep ibuprofen use to minimum. Can take Tylenol up to 2 g/day in divided dose on as-needed basis. Begin Linzess/linaclotide 145 mcg by mouth every morning.  Prescription sent to patient's pharmacy but patient advised not to fill until he is finished using samples.  He was given 32 doses. Prescription for pantoprazole 40 mg p.o. every morning 30 doses with 11 refills sent to patient's pharmacy. Patient will call with progress report in 4 weeks or so. Office visit in 3 months.

## 2020-10-12 ENCOUNTER — Other Ambulatory Visit (INDEPENDENT_AMBULATORY_CARE_PROVIDER_SITE_OTHER): Payer: Self-pay | Admitting: Internal Medicine

## 2020-10-12 ENCOUNTER — Telehealth (INDEPENDENT_AMBULATORY_CARE_PROVIDER_SITE_OTHER): Payer: Self-pay | Admitting: Internal Medicine

## 2020-10-12 MED ORDER — LINACLOTIDE 145 MCG PO CAPS: 145.0000 ug | ORAL_CAPSULE | Freq: Every day | ORAL | 3 refills | Status: DC

## 2020-10-12 MED ORDER — HYDROCORTISONE (PERIANAL) 2.5 % EX CREA: 1.0000 "application " | TOPICAL_CREAM | Freq: Two times a day (BID) | CUTANEOUS | 1 refills | Status: AC

## 2020-10-12 NOTE — Telephone Encounter (Signed)
I called Dr. Verna Czech office and asked that they fax the patient's labs from week of 10/04/2020 to our office.

## 2020-10-12 NOTE — Telephone Encounter (Signed)
I discussed the update with Dr. Karilyn Cota and he states he will call the patient after he reviews patient's lab results from Dr. Verna Czech office.  Labs sent from Dr. Verna Czech office, placed on Dr. Patty Sermons desk.

## 2020-10-12 NOTE — Telephone Encounter (Signed)
Patient left voice mail stating Dr Karilyn Cota wanted him to call back with a procress report - would like someone to call him at [phone]

## 2020-10-12 NOTE — Telephone Encounter (Signed)
Patient states Linzess is working His cost is $40 a month.  It may be cheaper if he gets mail order.  Prescription sent to mail order pharmacy. Patient also complains of hemorrhoids protruding out.  He has had this problem before.  He is not having any pain He can use Proctofoam HC cream twice daily until hemorrhoid shrink. Sitz bath will also help. If this therapy does not help he may need surgical intervention

## 2020-10-12 NOTE — Telephone Encounter (Signed)
I spoke with the patient and he wanted to report that the linzess is working well for him.  He does report that Saturday and Sunday he did a lot of straining and now has hemorrhoids and is using preparation H which has been helping and  he can deal with this.  He also reports that Dr. Wyline Mood drew labs on him last week.

## 2020-10-21 ENCOUNTER — Encounter (INDEPENDENT_AMBULATORY_CARE_PROVIDER_SITE_OTHER): Payer: Self-pay

## 2021-02-28 ENCOUNTER — Telehealth (INDEPENDENT_AMBULATORY_CARE_PROVIDER_SITE_OTHER): Payer: Self-pay | Admitting: *Deleted

## 2021-02-28 NOTE — Telephone Encounter (Signed)
Pt states about 4 months started linzess 145 mcg and it had been working well for his IBS. About 3 weeks ago he started having trouble passing gas and having some bloating, felt like something was in his anus and would not let the gas release. Wonders if he has internal hemorroid. States he stopped taking linzess 2 days ago and has not had a BM since then until this morning and it was a small amount about one teaspoon that was loose. Wife had gave him an enema prior to having that stool. He was having some pain and sat on an ice pack which helped and no pain now at this time. He tried 2 stools softners and a dulcolax also before having that BM. He states after taking otc gas tablets he could release gas for awhile but still having trouble releasing gas. Pt aware dr Karilyn Cota is out of office today and states he is not in pain now and ok with waiting for a response tomorrow. Last seen 09/28/20 for gerd and constipation. Has upcoming appt on 9/13  [facility] [phone]

## 2021-03-01 ENCOUNTER — Other Ambulatory Visit (INDEPENDENT_AMBULATORY_CARE_PROVIDER_SITE_OTHER): Payer: Self-pay | Admitting: *Deleted

## 2021-03-01 MED ORDER — HYDROCORTISONE ACETATE 25 MG RE SUPP: 25.0000 mg | Freq: Every evening | RECTAL | 0 refills | Status: AC

## 2021-03-01 MED ORDER — LINACLOTIDE 72 MCG PO CAPS: 72.0000 ug | ORAL_CAPSULE | Freq: Every day | ORAL | 0 refills | Status: AC

## 2021-03-01 NOTE — Telephone Encounter (Signed)
Consult with Dr. Karilyn Cota. Take fiber 4g daily. Sitz baths, and anusol hc supp one qhs. Med sent to pharm and discussed with pt. Pt then asked if he should go back on linzess. States he was on it for 4 months and did well but it started causing him to have gas 3 weeks ago. Stopped med 3 days ago and gas is better.

## 2021-03-01 NOTE — Telephone Encounter (Signed)
Consult with dr Karilyn Cota and change from 145 mcg to 72. Discussed with pt and med sent to pharm.

## 2021-03-07 ENCOUNTER — Telehealth (INDEPENDENT_AMBULATORY_CARE_PROVIDER_SITE_OTHER): Payer: Self-pay | Admitting: *Deleted

## 2021-03-07 NOTE — Telephone Encounter (Signed)
Pt called 8/15 and states he is doing anusol hc suppositories for the 7th night now. Has some relief. Drinking 40-60 ounces of water a day, eating fiber, still having issues passing gas. Wanted to ask Dr. Karilyn Cota is he thought it could be a prostate issue, states he woke up 3 times last night to urinate then not able to do much. Urinating better today. Urinated 8 times today and had to strain on one of those times. No pain with urination, no fever. I recommended pt call his pcp to have prostate/urine checked. He wanted me to still ask dr Karilyn Cota if it could be related to him not being able to pass gas.   Pt's number [phone]

## 2021-03-09 NOTE — Telephone Encounter (Signed)
Discussed with Dr. Karilyn Cota yesterday. Pt should take gaviscon bid for 5 days then as needed. Called pt and he wanted an appt. Per dr Karilyn Cota schedule for 8/18. Appt schedule 8/18 at 2:30 pt was notified of appt.

## 2021-03-10 ENCOUNTER — Encounter (INDEPENDENT_AMBULATORY_CARE_PROVIDER_SITE_OTHER): Payer: Self-pay

## 2021-03-10 ENCOUNTER — Encounter (INDEPENDENT_AMBULATORY_CARE_PROVIDER_SITE_OTHER): Payer: Self-pay | Admitting: Internal Medicine

## 2021-03-10 ENCOUNTER — Ambulatory Visit (INDEPENDENT_AMBULATORY_CARE_PROVIDER_SITE_OTHER): Payer: Medicare PPO | Admitting: Internal Medicine

## 2021-03-10 ENCOUNTER — Other Ambulatory Visit (INDEPENDENT_AMBULATORY_CARE_PROVIDER_SITE_OTHER): Payer: Self-pay

## 2021-03-10 ENCOUNTER — Other Ambulatory Visit: Payer: Self-pay

## 2021-03-10 ENCOUNTER — Telehealth (INDEPENDENT_AMBULATORY_CARE_PROVIDER_SITE_OTHER): Payer: Self-pay

## 2021-03-10 VITALS — BP 128/81 | HR 90 | Temp 99.2°F | Ht 68.0 in | Wt 171.0 lb

## 2021-03-10 DIAGNOSIS — R143 Flatulence: Secondary | ICD-10-CM | POA: Diagnosis not present

## 2021-03-10 DIAGNOSIS — K5909 Other constipation: Secondary | ICD-10-CM

## 2021-03-10 DIAGNOSIS — K6289 Other specified diseases of anus and rectum: Secondary | ICD-10-CM

## 2021-03-10 MED ORDER — HYOSCYAMINE SULFATE 0.125 MG SL SUBL: 0.1250 mg | SUBLINGUAL_TABLET | Freq: Three times a day (TID) | SUBLINGUAL | 1 refills | Status: AC | PRN

## 2021-03-10 MED ORDER — PEG 3350-KCL-NA BICARB-NACL 420 G PO SOLR: 4000.0000 mL | Freq: Once | ORAL | 0 refills | Status: AC

## 2021-03-10 NOTE — Progress Notes (Signed)
Presenting complaint;  Constipation bloating and rectal pain.  Database hand subjective:  Patient is 70 year old Caucasian male who has chronic GERD complicated by short segment Barrett's esophagus as well as history of constipation who was last seen on 09/28/2020.  He was begun on Linzess/linaclotide at a dose of 145 mcg daily.  He was advised to discontinue polyethylene glycol and senna. He has screening colonoscopy by Dr. Marcha Solders of [facility] in April 2019 and was within normal limits.  Patient called our office on 10/12/2020 to indicate that Linzess was working well for him.  He did report some straining and the reported hemorrhoids are protruding out.  He had been using Preparation H.  He was advised to use Proctofoam HC and sitz bath's.  Patient called office 10 days ago stating that he was having difficulty expelling gas.  He felt bloated.  He felt as if there was something in his rectum which would prevent him from passing flatus.  He reported symptoms started about 3 weeks ago.  He had stopped taking Linzess on 02/11/2021.  And was constipated.  He had an enema.  He has started stool softener and a gas pill.  Patient is advised to take fiber supplement 4 g daily and use Anus at a low dose.  Ol suppository at bedtime.  We also recommended he should go back on Linzess.  He was not getting any better and therefore this visit was arranged.  Patient states while he was on Linzess 145 mcg daily he never had a regular bowel movement.  His stool was always loose.  He felt was too strong.  He decided to stop it on 02/11/2021. His biggest symptom now is that he is not able to expel gas.  It results in abdominal discomfort.  For his hemorrhoids he used suppository few times but then he felt like he had severe burning in his rectum and stop using the suppositories.  He had been on Colace and fiber Gummies.  He goes to the bathroom 3-4 times a day and each time he may pass a small amount of stool or gas or nothing.   His appetite is about the same.  It is not normal.  He has lost 17 pounds since his last visit of September 28, 2020.  He denies melena or rectal bleeding.  His past Sunday he took 5 ounces of mag citrate 1 Dulcolax tablet along with 3 Colace tablets and he finally had a bowel movement yesterday.  He has not experienced nausea or vomiting. He feels pantoprazole is working very well.  He rarely has heartburn.  He denies dysphagia. He is not able to do much walking because of left leg weakness due to polio.  His diet has not changed any.  He reminds me that his sister had colon rectal carcinoma in her 40s and died within 2 years of diagnosis.    Current Medications: Outpatient Encounter Medications as of 03/10/2021  Medication Sig   acetaminophen (TYLENOL) 650 MG CR tablet Take 650 mg by mouth every 8 (eight) hours as needed for pain.   Alum Hydroxide-Mag Carbonate (GAVISCON PO) Take by mouth. Takes one bid   Ascorbic Acid (VITAMIN C) 1000 MG tablet Take 1,000 mg by mouth daily.   baclofen (LIORESAL) 10 MG tablet Take 10 mg by mouth 2 (two) times daily as needed for muscle spasms.   Calcium Carbonate Antacid (TUMS PO) Take by mouth as needed.   Cholecalciferol (VITAMIN D) 125 MCG (5000 UT) CAPS Take by  mouth. One daily   Cyanocobalamin (VITAMIN B12 PO) Take 1,000 mcg by mouth daily.   docusate sodium (COLACE) 100 MG capsule Take 100 mg by mouth 2 (two) times daily.   Doxylamine Succinate, Sleep, (UNISOM PO) Take 50 mg by mouth at bedtime.   famotidine (PEPCID) 10 MG tablet Take 10 mg by mouth at bedtime.   linaclotide (LINZESS) 72 MCG capsule Take 1 capsule (72 mcg total) by mouth daily before breakfast.   lisinopril (PRINIVIL,ZESTRIL) 20 MG tablet Take 20 mg by mouth daily.   loratadine (CLARITIN) 10 MG tablet Take 10 mg by mouth as needed for allergies.   Melatonin 10 MG TABS Take by mouth. One qhs   METAMUCIL FIBER PO Take by mouth. gummies   Multiple Vitamin (MULTIVITAMIN WITH MINERALS) TABS  tablet Take 1 tablet by mouth daily.   pantoprazole (PROTONIX) 40 MG tablet Take 1 tablet (40 mg total) by mouth daily before breakfast.   Probiotic Product (PHILLIPS COLON HEALTH PO) Take 1 capsule by mouth daily.   simethicone (MYLICON) 125 MG chewable tablet Chew 125 mg by mouth as needed for flatulence.   [DISCONTINUED] FIBER SELECT GUMMIES PO Take by mouth. Patient takes 3 at bedtime   [DISCONTINUED] Melatonin 1 MG CAPS Take by mouth at bedtime.   [DISCONTINUED] VITAMIN D PO Take 2,000 Units by mouth. This is Vitamin D 3.   hydrocortisone (ANUSOL-HC) 2.5 % rectal cream Place 1 application rectally 2 (two) times daily. (Patient not taking: Reported on 03/10/2021)   hydrocortisone (ANUSOL-HC) 25 MG suppository Place 1 suppository (25 mg total) rectally at bedtime. (Patient not taking: Reported on 03/10/2021)   [DISCONTINUED] ibuprofen (ADVIL,MOTRIN) 200 MG tablet Take 200 mg by mouth every evening.   No facility-administered encounter medications on file as of 03/10/2021.     Objective: Blood pressure 128/81, pulse 90, temperature 99.2 F (37.3 C), temperature source Oral, height 5\' 8"  (1.727 m), weight 171 lb (77.6 kg). Patient is alert and in no acute distress. Oropharyngeal mucosa is normal. Conjunctiva is pink. Sclera is nonicteric Oropharyngeal mucosa is normal. No neck masses or thyromegaly noted. Cardiac exam with regular rhythm normal S1 and S2. No murmur or gallop noted. Lungs are clear to auscultation. Abdomen is symmetrical.  Bowel sounds are normal.  On palpation abdomen is soft and nontender with organomegaly or masses. No LE edema or clubbing noted. He has left ankle and knee brace and there is diffuse muscle atrophy to right lower extremity.  Labs/studies Results:  Lab data from 10/04/2020  WBC 5.5, H&H 15.7 and 45.6 and platelet count 277K  Glucose 102, BUN 9, creatinine 0.73 Serum sodium 137, potassium 5.0, chloride 101, CO2 21 Bilirubin 0.7, AP 67, AST 27, ALT  30, total protein 7.1 and albumin 4.8. Serum calcium 9.8 Free T4 1.35 TSH 2.600 Hemoglobin A1c 5.7   Assessment:  #1.  Change in bowel habits.  He has a history of chronic constipation.  He is not having constipation as well as difficulty to expel gas associated with abdominal and rectal pain which seem to improve when he passes flatus.  He has lost 17 pounds since his last visit which is very worrisome.  His symptoms are suggestive of colonic stricture or he could simply have worsening colonic dysmotility. His last colonoscopy was 3 years ago and I am afraid it needs to be repeated.  #2.  Chronic GERD complicated by short segment Barrett's esophagus.  Last EGD was in April 2019.  He is doing well with therapy.  Plan:  Levsin/hyoscyamine sublingual 1 tablet up to 3 times daily for abdominal rectal pain as needed. He can take simethicone 125 mg up to 3 times a day. Discontinue Metamucil. Continue Linzess 72 mcg p.o. every morning. Diagnostic colonoscopy under monitored anesthesia care in near future.

## 2021-03-10 NOTE — Patient Instructions (Signed)
Levsin/hyoscyamine sublingual up to 3 times a day as needed for rectal pain. Can take simethicone up to 3 times a day as needed. Colonoscopy to be scheduled in near future.

## 2021-03-10 NOTE — Telephone Encounter (Signed)
LeighAnn Cecilio Ohlrich, CMA  

## 2021-03-14 ENCOUNTER — Encounter (HOSPITAL_COMMUNITY): Payer: Self-pay

## 2021-03-14 ENCOUNTER — Encounter (HOSPITAL_COMMUNITY)
Admission: RE | Admit: 2021-03-14 | Discharge: 2021-03-14 | Disposition: A | Discharge status: Home / Self Care | Payer: Medicare PPO | Source: Ambulatory Visit | Attending: Internal Medicine | Admitting: Internal Medicine

## 2021-03-14 ENCOUNTER — Other Ambulatory Visit: Payer: Self-pay

## 2021-03-14 DIAGNOSIS — K5909 Other constipation: Secondary | ICD-10-CM | POA: Insufficient documentation

## 2021-03-14 DIAGNOSIS — R143 Flatulence: Secondary | ICD-10-CM

## 2021-03-14 DIAGNOSIS — I4510 Unspecified right bundle-branch block: Secondary | ICD-10-CM | POA: Insufficient documentation

## 2021-03-14 DIAGNOSIS — Z01818 Encounter for other preprocedural examination: Secondary | ICD-10-CM | POA: Insufficient documentation

## 2021-03-14 DIAGNOSIS — A419 Sepsis, unspecified organism: Secondary | ICD-10-CM | POA: Diagnosis not present

## 2021-03-14 DIAGNOSIS — K6289 Other specified diseases of anus and rectum: Secondary | ICD-10-CM | POA: Insufficient documentation

## 2021-03-14 DIAGNOSIS — R339 Retention of urine, unspecified: Secondary | ICD-10-CM | POA: Diagnosis not present

## 2021-03-14 LAB — BASIC METABOLIC PANEL
Anion gap: 6 (ref 5–15)
BUN: 13 mg/dL (ref 8–23)
CO2: 25 mmol/L (ref 22–32)
Calcium: 9.2 mg/dL (ref 8.9–10.3)
Chloride: 101 mmol/L (ref 98–111)
Creatinine, Ser: 0.7 mg/dL (ref 0.61–1.24)
GFR, Estimated: >60 mL/min (ref >60)
Glucose, Bld: 95 mg/dL (ref 70–99)
Potassium: 4.2 mmol/L (ref 3.5–5.1)
Sodium: 132 mmol/L — ABNORMAL LOW (ref 135–145)

## 2021-03-14 NOTE — Patient Instructions (Signed)
ANDREU DRUDGE  03/14/2021     @PREFPERIOPPHARMACY @   Your procedure is scheduled on March 24, 2021.  Report to 03/18/2021 at 7:25 A.M.  Call this number if you have problems the morning of surgery:  [phone]   Remember:  Do not eat or drink after midnight.     Please follow the diet and prep instructions given to you by Dr [phone] office.    Take these medicines the morning of surgery with A SIP OF WATER : Pepcid, Protonix and Claritin    Do not wear jewelry, make-up or nail polish.  Do not wear lotions, powders, or perfumes, or deodorant.  Do not shave 48 hours prior to surgery.  Men may shave face and neck.  Do not bring valuables to the hospital.  [facility] is not responsible for any belongings or valuables.  Contacts, dentures or bridgework may not be worn into surgery.  Leave your suitcase in the car.  After surgery it may be brought to your room.  For patients admitted to the hospital, discharge time will be determined by your treatment team.  Patients discharged the day of surgery will not be allowed to drive home.   Name and phone number of your driver:   Family Special instructions:  N/A\  Please read over the following fact sheets that you were given. Anesthesia Post-op Instructions  Colonoscopy, Adult A colonoscopy is a procedure to look at the entire large intestine. This procedure is done using a long, thin, flexible tube that has a camera on theend. You may have a colonoscopy: As a part of normal colorectal screening. If you have certain symptoms, such as: A low number of red blood cells in your blood (anemia). Diarrhea that does not go away. Pain in your abdomen. Blood in your stool. A colonoscopy can help screen for and diagnose medical problems, including: Tumors. Extra tissue that grows where mucus forms (polyps). Inflammation. Areas of bleeding. Tell your health care provider about: Any allergies you have. All medicines you are taking,  including vitamins, herbs, eye drops, creams, and over-the-counter medicines. Any problems you or family members have had with anesthetic medicines. Any blood disorders you have. Any surgeries you have had. Any medical conditions you have. Any problems you have had with having bowel movements. Whether you are pregnant or may be pregnant. What are the risks? Generally, this is a safe procedure. However, problems may occur, including: Bleeding. Damage to your intestine. Allergic reactions to medicines given during the procedure. Infection. This is rare. What happens before the procedure? Eating and drinking restrictions Follow instructions from your health care provider about eating or drinking restrictions, which may include: A few days before the procedure: Follow a low-fiber diet. Avoid nuts, seeds, dried fruit, raw fruits, and vegetables. 1-3 days before the procedure: Eat only gelatin dessert or ice pops. Drink only clear liquids, such as water, clear juice, clear broth or bouillon, black coffee or tea, or clear soft drinks or sports drinks. Avoid liquids that contain red or purple dye. The day of the procedure: Do not eat solid foods. You may continue to drink clear liquids until up to 2 hours before the procedure. Do not eat or drink anything starting 2 hours before the procedure, or within the time period that your health care provider recommends. Bowel prep If you were prescribed a bowel prep to take by mouth (orally) to clean out your colon: Take it as told by your health care provider. Starting the  day before your procedure, you will need to drink a large amount of liquid medicine. The liquid will cause you to have many bowel movements of loose stool until your stool becomes almost clear or light green. If your skin or the opening between the buttocks (anus) gets irritated from diarrhea, you may relieve the irritation using: Wipes with medicine in them, such as adult wet wipes  with aloe and vitamin E. A product to soothe skin, such as petroleum jelly. If you vomit while drinking the bowel prep: Take a break for up to 60 minutes. Begin the bowel prep again. Call your health care provider if you keep vomiting or you cannot take the bowel prep without vomiting. To clean out your colon, you may also be given: Laxative medicines. These help you have a bowel movement. Instructions for enema use. An enema is liquid medicine injected into your rectum. Medicines Ask your health care provider about: Changing or stopping your regular medicines or supplements. This is especially important if you are taking iron supplements, diabetes medicines, or blood thinners. Taking medicines such as aspirin and ibuprofen. These medicines can thin your blood. Do not take these medicines unless your health care provider tells you to take them. Taking over-the-counter medicines, vitamins, herbs, and supplements. General instructions Ask your health care provider what steps will be taken to help prevent infection. These may include washing skin with a germ-killing soap. Plan to have someone take you home from the hospital or clinic. What happens during the procedure?  An IV will be inserted into one of your veins. You may be given one or more of the following: A medicine to help you relax (sedative). A medicine to numb the area (local anesthetic). A medicine to make you fall asleep (general anesthetic). This is rarely needed. You will lie on your side with your knees bent. The tube will: Have oil or gel put on it (be lubricated). Be inserted into your anus. Be gently eased through all parts of your large intestine. Air will be sent into your colon to keep it open. This may cause some pressure or cramping. Images will be taken with the camera and will appear on a screen. A small tissue sample may be removed to be looked at under a microscope (biopsy). The tissue may be sent to a lab for  testing if any signs of problems are found. If small polyps are found, they may be removed and checked for cancer cells. When the procedure is finished, the tube will be removed. The procedure may vary among health care providers and hospitals. What happens after the procedure? Your blood pressure, heart rate, breathing rate, and blood oxygen level will be monitored until you leave the hospital or clinic. You may have a small amount of blood in your stool. You may pass gas and have mild cramping or bloating in your abdomen. This is caused by the air that was used to open your colon during the exam. Do not drive for 24 hours after the procedure. It is up to you to get the results of your procedure. Ask your health care provider, or the department that is doing the procedure, when your results will be ready. Summary A colonoscopy is a procedure to look at the entire large intestine. Follow instructions from your health care provider about eating and drinking before the procedure. If you were prescribed an oral bowel prep to clean out your colon, take it as told by your health care provider. During  the colonoscopy, a flexible tube with a camera on its end is inserted into the anus and then passed into the other parts of the large intestine. This information is not intended to replace advice given to you by your health care provider. Make sure you discuss any questions you have with your healthcare provider. Document Revised: 01/31/2019 Document Reviewed: 01/31/2019 Elsevier Patient Education  2022 [employer]  Monitored Anesthesia Care Anesthesia refers to techniques, procedures, and medicines that help a person stay safe and comfortable during a medical or dental procedure. Monitored anesthesia care, or sedation, is one type of anesthesia. Your anesthesia specialist may recommend sedation if you will be having a procedure that does not require you to be unconscious. You may have this procedure  for: Cataract surgery. A dental procedure. A biopsy. A colonoscopy. During the procedure, you may receive a medicine to help you relax (sedative). There are three levels of sedation: Mild sedation. At this level, you may feel awake and relaxed. You will be able to follow directions. Moderate sedation. At this level, you will be sleepy. You may not remember the procedure. Deep sedation. At this level, you will be asleep. You will not remember the procedure. The more medicine you are given, the deeper your level of sedation will be. Depending on how you respond to the procedure, the anesthesia specialist may change your level of sedation or the type of anesthesia to fit your needs. Ananesthesia specialist will monitor you closely during the procedure. Tell a health care provider about: Any allergies you have. All medicines you are taking, including vitamins, herbs, eye drops, creams, and over-the-counter medicines. Any problems you or family members have had with anesthetic medicines. Any blood disorders you have. Any surgeries you have had. Any medical conditions you have, such as sleep apnea. Whether you are pregnant or may be pregnant. Whether you use cigarettes, alcohol, or drugs. Any use of steroids, whether by mouth or as a cream. What are the risks? Generally, this is a safe procedure. However, problems may occur, including: Getting too much medicine (oversedation). Nausea. Allergic reaction to medicines. Trouble breathing. If this happens, a breathing tube may be used to help with breathing. It will be removed when you are awake and breathing on your own. Heart trouble. Lung trouble. Confusion that gets better with time (emergence delirium). What happens before the procedure? Staying hydrated Follow instructions from your health care provider about hydration, which may include: Up to 2 hours before the procedure - you may continue to drink clear liquids, such as water, clear  fruit juice, black coffee, and plain tea. Eating and drinking restrictions Follow instructions from your health care provider about eating and drinking, which may include: 8 hours before the procedure - stop eating heavy meals or foods, such as meat, fried foods, or fatty foods. 6 hours before the procedure - stop eating light meals or foods, such as toast or cereal. 6 hours before the procedure - stop drinking milk or drinks that contain milk. 2 hours before the procedure - stop drinking clear liquids. Medicines Ask your health care provider about: Changing or stopping your regular medicines. This is especially important if you are taking diabetes medicines or blood thinners. Taking medicines such as aspirin and ibuprofen. These medicines can thin your blood. Do not take these medicines unless your health care provider tells you to take them. Taking over-the-counter medicines, vitamins, herbs, and supplements. Tests and exams You will have a physical exam. You may have blood  tests done to show: How well your kidneys and liver are working. How well your blood can clot. General instructions Plan to have a responsible adult take you home from the hospital or clinic. If you will be going home right after the procedure, plan to have a responsible adult care for you for the time you are told. This is important. What happens during the procedure?  Your blood pressure, heart rate, breathing, level of pain, and overall condition will be monitored. An IV will be inserted into one of your veins. You will be given medicines as needed to keep you comfortable during the procedure. This may mean changing the level of sedation. Depending on your age or the procedure, the sedative may be given: As a pill that you will swallow or as a pill that is inserted into the rectum. As an injection into the vein or muscle. As a spray through the nose. The procedure will be performed. Your breathing, heart rate,  and blood pressure will be monitored during the procedure. When the procedure is over, the medicine will be stopped. The procedure may vary among health care providers and hospitals. What happens after the procedure? Your blood pressure, heart rate, breathing rate, and blood oxygen level will be monitored until you leave the hospital or clinic. You may feel sleepy, clumsy, or nauseous. You may feel forgetful about what happened after the procedure. You may vomit. You may continue to get IV fluids. Do not drive or operate machinery until your health care provider says that it is safe. Summary Monitored anesthesia care is used to keep a patient comfortable during short procedures. Tell your health care provider about any allergies or health conditions you have and about all the medicines you are taking. Before the procedure, follow instructions about when to stop eating and drinking and about changing or stopping any medicines. Your blood pressure, heart rate, breathing rate, and blood oxygen level will be monitored until you leave the hospital or clinic. Plan to have a responsible adult take you home from the hospital or clinic. This information is not intended to replace advice given to you by your health care provider. Make sure you discuss any questions you have with your healthcare provider. Document Revised: 03/25/2020 Document Reviewed: 06/12/2019 Elsevier Patient Education  2022 [employer]

## 2021-03-15 ENCOUNTER — Encounter (HOSPITAL_COMMUNITY): Payer: Self-pay | Admitting: Emergency Medicine

## 2021-03-15 ENCOUNTER — Encounter (HOSPITAL_COMMUNITY): Payer: Self-pay | Admitting: Anesthesiology

## 2021-03-15 ENCOUNTER — Inpatient Hospital Stay (HOSPITAL_COMMUNITY)
Admission: EM | Admit: 2021-03-15 | Discharge: 2021-03-24 | DRG: 853 | Disposition: E | Payer: Medicare PPO | Attending: Internal Medicine | Admitting: Internal Medicine

## 2021-03-15 ENCOUNTER — Emergency Department (HOSPITAL_COMMUNITY)
Admission: EM | Admit: 2021-03-15 | Discharge: 2021-03-15 | Disposition: A | Discharge status: Home / Self Care | Payer: Medicare PPO | Source: Home / Self Care | Attending: Emergency Medicine | Admitting: Emergency Medicine

## 2021-03-15 ENCOUNTER — Other Ambulatory Visit: Payer: Self-pay

## 2021-03-15 DIAGNOSIS — Z20822 Contact with and (suspected) exposure to covid-19: Secondary | ICD-10-CM | POA: Diagnosis present

## 2021-03-15 DIAGNOSIS — J189 Pneumonia, unspecified organism: Secondary | ICD-10-CM | POA: Diagnosis present

## 2021-03-15 DIAGNOSIS — E872 Acidosis, unspecified: Secondary | ICD-10-CM

## 2021-03-15 DIAGNOSIS — Z8249 Family history of ischemic heart disease and other diseases of the circulatory system: Secondary | ICD-10-CM

## 2021-03-15 DIAGNOSIS — R339 Retention of urine, unspecified: Secondary | ICD-10-CM | POA: Diagnosis present

## 2021-03-15 DIAGNOSIS — Z79899 Other long term (current) drug therapy: Secondary | ICD-10-CM | POA: Insufficient documentation

## 2021-03-15 DIAGNOSIS — K219 Gastro-esophageal reflux disease without esophagitis: Secondary | ICD-10-CM | POA: Diagnosis present

## 2021-03-15 DIAGNOSIS — K5289 Other specified noninfective gastroenteritis and colitis: Secondary | ICD-10-CM | POA: Diagnosis present

## 2021-03-15 DIAGNOSIS — R6521 Severe sepsis with septic shock: Secondary | ICD-10-CM | POA: Diagnosis present

## 2021-03-15 DIAGNOSIS — R7401 Elevation of levels of liver transaminase levels: Secondary | ICD-10-CM | POA: Diagnosis not present

## 2021-03-15 DIAGNOSIS — K5900 Constipation, unspecified: Secondary | ICD-10-CM | POA: Insufficient documentation

## 2021-03-15 DIAGNOSIS — Z880 Allergy status to penicillin: Secondary | ICD-10-CM

## 2021-03-15 DIAGNOSIS — I10 Essential (primary) hypertension: Secondary | ICD-10-CM | POA: Diagnosis present

## 2021-03-15 DIAGNOSIS — Z809 Family history of malignant neoplasm, unspecified: Secondary | ICD-10-CM

## 2021-03-15 DIAGNOSIS — R109 Unspecified abdominal pain: Secondary | ICD-10-CM

## 2021-03-15 DIAGNOSIS — J9601 Acute respiratory failure with hypoxia: Secondary | ICD-10-CM

## 2021-03-15 DIAGNOSIS — Z888 Allergy status to other drugs, medicaments and biological substances status: Secondary | ICD-10-CM

## 2021-03-15 DIAGNOSIS — N179 Acute kidney failure, unspecified: Secondary | ICD-10-CM | POA: Diagnosis not present

## 2021-03-15 DIAGNOSIS — E871 Hypo-osmolality and hyponatremia: Secondary | ICD-10-CM | POA: Diagnosis present

## 2021-03-15 DIAGNOSIS — K631 Perforation of intestine (nontraumatic): Secondary | ICD-10-CM

## 2021-03-15 DIAGNOSIS — K5641 Fecal impaction: Secondary | ICD-10-CM

## 2021-03-15 DIAGNOSIS — Z8612 Personal history of poliomyelitis: Secondary | ICD-10-CM

## 2021-03-15 DIAGNOSIS — Z452 Encounter for adjustment and management of vascular access device: Secondary | ICD-10-CM

## 2021-03-15 DIAGNOSIS — A419 Sepsis, unspecified organism: Principal | ICD-10-CM | POA: Diagnosis present

## 2021-03-15 DIAGNOSIS — R739 Hyperglycemia, unspecified: Secondary | ICD-10-CM | POA: Diagnosis not present

## 2021-03-15 DIAGNOSIS — K559 Vascular disorder of intestine, unspecified: Secondary | ICD-10-CM

## 2021-03-15 DIAGNOSIS — J690 Pneumonitis due to inhalation of food and vomit: Secondary | ICD-10-CM

## 2021-03-15 DIAGNOSIS — Z833 Family history of diabetes mellitus: Secondary | ICD-10-CM

## 2021-03-15 MED ORDER — FLEET ENEMA 7-19 GM/118ML RE ENEM
1.0000 | ENEMA | Freq: Once | RECTAL | Status: AC
  Administered 2021-03-15: 1 via RECTAL

## 2021-03-15 NOTE — ED Notes (Signed)
Pt has had no BM since [place].

## 2021-03-15 NOTE — ED Provider Notes (Signed)
[facility] EMERGENCY DEPARTMENT Provider Note   CSN: [id number] Arrival date & time: 03/02/2021  0404     History Chief Complaint  Patient presents with   Urinary Retention    Christopher Aguilar is a 70 y.o. male.  Patient is a 70 year old male with past medical history of hypertension and GERD.  He presents today for evaluation of urinary retention and fecal retention.  Patient states for 2 weeks, he has been having difficulty having a bowel movement and is concerned he may have a blockage at his rectum.  He was seen by gastroenterology several days ago and is scheduled to undergo colonoscopy tomorrow.  This evening, he developed an inability to urinate and presents for evaluation of these.  He denies any bloody stool.  He denies any fevers or chills.  He denies any abdominal or rectal pain.  The history is provided by the patient.      Past Medical History:  Diagnosis Date   GERD (gastroesophageal reflux disease)    Hypertension    Polio    as a child, has a very mild limp.    Patient Active Problem List   Diagnosis Date Noted   Rectal pain 03/10/2021   Flatulence 03/10/2021   GERD (gastroesophageal reflux disease) 09/23/2019   Chronic constipation 09/23/2019   Upper abdominal pain 03/15/2018   Nausea without vomiting 03/15/2018   Closed fracture of left distal radius    Closed fracture of left distal radius and ulna, initial encounter 08/17/2016    Past Surgical History:  Procedure Laterality Date   BIOPSY  03/20/2018   Procedure: BIOPSY;  Surgeon: Malissa Hippo, MD;  Location: [facility];  Service: Endoscopy;;  gastric   CHOLECYSTECTOMY     ESOPHAGOGASTRODUODENOSCOPY N/A 03/20/2018   Procedure: ESOPHAGOGASTRODUODENOSCOPY (EGD);  Surgeon: Malissa Hippo, MD;  Location: [facility];  Service: Endoscopy;  Laterality: N/A;  325   GALLBLADDER SURGERY  03/2015   OPEN REDUCTION INTERNAL FIXATION (ORIF) DISTAL RADIAL FRACTURE Left 08/23/2016   Procedure: OPEN  REDUCTION INTERNAL FIXATION (ORIF) LEFT DISTAL RADIUS FRACTURE;  Surgeon: Tarry Kos, MD;  Location: [facility];  Service: Orthopedics;  Laterality: Left;   POLYPECTOMY  03/20/2018   Procedure: POLYPECTOMY;  Surgeon: Malissa Hippo, MD;  Location: [facility];  Service: Endoscopy;;  gastric        Family History  Problem Relation Age of Onset   Diabetes Mother    Hypertension Mother    Cancer Sister    Diabetes Brother    Hypertension Brother     Social History   Tobacco Use   Smoking status: Never   Smokeless tobacco: Never  Vaping Use   Vaping Use: Never used  Substance Use Topics   Alcohol use: No   Drug use: No    Home Medications Prior to Admission medications   Medication Sig Start Date End Date Taking? Authorizing Provider  acetaminophen (TYLENOL) 650 MG CR tablet Take 650 mg by mouth every 8 (eight) hours as needed for pain.    [provider]  Alum Hydroxide-Mag Carbonate (GAVISCON PO) Take 1 tablet by mouth in the morning, at noon, and at bedtime.    [provider]  Ascorbic Acid (VITAMIN C) 1000 MG tablet Take 1,000 mg by mouth daily.    [provider]  baclofen (LIORESAL) 10 MG tablet Take 10 mg by mouth 2 (two) times daily as needed for muscle spasms.    [provider]  calcium carbonate (TUMS - DOSED IN MG ELEMENTAL CALCIUM) 500 MG chewable tablet Chew 1 tablet by mouth daily as needed for heartburn or indigestion.    [provider]  Cholecalciferol (VITAMIN D) 125 MCG (5000 UT) CAPS Take 5,000 Units by mouth daily.    [provider]  Cyanocobalamin (VITAMIN B12) 1000 MCG TBCR Take 1,000 mcg by mouth daily.    [provider]  docusate sodium (COLACE) 100 MG capsule Take 100 mg by mouth 2 (two) times daily.    [provider]  Doxylamine Succinate, Sleep, (UNISOM PO) Take 50 mg by mouth at bedtime.    [provider]  famotidine (PEPCID) 10 MG tablet Take 10  mg by mouth at bedtime.    [provider]  hydrocortisone (ANUSOL-HC) 2.5 % rectal cream Place 1 application rectally 2 (two) times daily. Patient taking differently: Place 1 application rectally 2 (two) times daily as needed for hemorrhoids. 10/12/20   Malissa Hippo, MD  hydrocortisone (ANUSOL-HC) 25 MG suppository Place 1 suppository (25 mg total) rectally at bedtime. 03/01/21   Rehman, Joline Maxcy, MD  hyoscyamine (LEVSIN SL) 0.125 MG SL tablet Place 1 tablet (0.125 mg total) under the tongue 3 (three) times daily as needed. 03/10/21   Malissa Hippo, MD  linaclotide (LINZESS) 72 MCG capsule Take 1 capsule (72 mcg total) by mouth daily before breakfast. 03/01/21   Rehman, Joline Maxcy, MD  lisinopril (PRINIVIL,ZESTRIL) 20 MG tablet Take 20 mg by mouth in the morning.    [provider]  loratadine (CLARITIN) 10 MG tablet Take 10 mg by mouth daily as needed for allergies.    [provider]  Melatonin 10 MG TABS Take 10 mg by mouth at bedtime. One qhs    [provider]  Multiple Vitamin (MULTIVITAMIN WITH MINERALS) TABS tablet Take 1 tablet by mouth daily.    [provider]  pantoprazole (PROTONIX) 40 MG tablet Take 1 tablet (40 mg total) by mouth daily before breakfast. 09/28/20   Rehman, Joline Maxcy, MD  Probiotic Product (PHILLIPS COLON HEALTH PO) Take 1 capsule by mouth daily.    [provider]  simethicone (MYLICON) 125 MG chewable tablet Chew 125 mg by mouth as needed for flatulence.    [provider]    Allergies    Lipitor [atorvastatin] and Penicillins  Review of Systems   Review of Systems  All other systems reviewed and are negative.  Physical Exam Updated Vital Signs BP (!) 149/81   Pulse 81   Temp 99 F (37.2 C) (Oral)   Resp 18   Ht 5\' 9"  (1.753 m)   Wt 77.6 kg   SpO2 97%   BMI 25.27 kg/m   Physical Exam Vitals and nursing note reviewed.  Constitutional:      General: He is not in acute distress.     Appearance: He is well-developed. He is not diaphoretic.  HENT:     Head: Normocephalic and atraumatic.  Cardiovascular:     Rate and Rhythm: Normal rate and regular rhythm.     Heart sounds: No murmur heard.   No friction rub.  Pulmonary:     Effort: Pulmonary effort is normal. No respiratory distress.     Breath sounds: Normal breath sounds. No wheezing or rales.  Abdominal:     General: Bowel sounds are normal. There is no distension.     Palpations: Abdomen is soft.     Tenderness: There is no abdominal tenderness.  Genitourinary:    Comments: There is a sizable stool ball noted in the rectal vault.  There are no hemorrhoids or palpable masses or strictures. Musculoskeletal:        General: Normal range of motion.     Cervical back: Normal range of motion and neck supple.  Skin:    General: Skin is warm and dry.  Neurological:     Mental Status: He is alert and oriented to person, place, and time.     Coordination: Coordination normal.    ED Results / Procedures / Treatments   Labs (all labs ordered are listed, but only abnormal results are displayed) Labs Reviewed - No data to display  EKG None  Radiology No results found.  Procedures Procedures   Medications Ordered in ED Medications - No data to display  ED Course  I have reviewed the triage vital signs and the nursing notes.  Pertinent labs & imaging results that were available during my care of the patient were reviewed by me and considered in my medical decision making (see chart for details).    MDM Rules/Calculators/A&P  Patient is a 70 year old male presenting with complaints of urinary retention and constipation.  He is currently being worked up by gastroenterology for possible Veterinary surgeon.  He is due to have a colonoscopy tomorrow.  Today he presents with an inability to urinate.  On exam, rectum is normal in appearance.  Digital rectal examination reveals stool within the rectal vault,  but no definitive masses.  I do not feel an enlargement of his prostate.  Patient was given an enema with minimal results.  A catheter was placed in the bladder and 700+ cc of urine were obtained.  Patient is feeling better at this time.  I suspect constipation is leading to his urinary retention.  He has a bowel prep at home that he is supposed to start later this morning.  I have advised him to begin this as this may relieve his constipation.  Final Clinical Impression(s) / ED Diagnoses Final diagnoses:  None    Rx / DC Orders ED Discharge Orders     None        Geoffery Lyons, MD 03/08/2021 [phone]

## 2021-03-15 NOTE — ED Triage Notes (Signed)
Pt states he is done with bowel prep for colonoscopy and has taken several enemas. Pt states when his stomach contracts bowels seeps out of rectum. Pt states he has very little stool output.

## 2021-03-15 NOTE — ED Triage Notes (Signed)
Pt c/o urinary retention since waking this am. Pt also c/o rectal pain and is supposed to have colonoscopy Wed.

## 2021-03-15 NOTE — Discharge Instructions (Addendum)
Begin using the bowel prep as previously instructed.  Return to the emergency department if you develop severe abdominal pain, bloody stools, high fever, or other new and concerning symptoms.

## 2021-03-16 ENCOUNTER — Encounter (HOSPITAL_COMMUNITY): Admission: EM | Disposition: E | Payer: Self-pay | Source: Home / Self Care | Attending: Internal Medicine

## 2021-03-16 ENCOUNTER — Ambulatory Visit (HOSPITAL_COMMUNITY): Admission: RE | Admit: 2021-03-16 | Payer: Medicare PPO | Source: Home / Self Care | Admitting: Internal Medicine

## 2021-03-16 ENCOUNTER — Emergency Department (HOSPITAL_COMMUNITY): Payer: Medicare PPO | Admitting: Certified Registered"

## 2021-03-16 ENCOUNTER — Emergency Department (HOSPITAL_COMMUNITY): Payer: Medicare PPO

## 2021-03-16 ENCOUNTER — Other Ambulatory Visit: Payer: Self-pay

## 2021-03-16 DIAGNOSIS — K5289 Other specified noninfective gastroenteritis and colitis: Secondary | ICD-10-CM | POA: Diagnosis present

## 2021-03-16 DIAGNOSIS — K559 Vascular disorder of intestine, unspecified: Secondary | ICD-10-CM

## 2021-03-16 DIAGNOSIS — K219 Gastro-esophageal reflux disease without esophagitis: Secondary | ICD-10-CM | POA: Diagnosis present

## 2021-03-16 DIAGNOSIS — Z8249 Family history of ischemic heart disease and other diseases of the circulatory system: Secondary | ICD-10-CM | POA: Diagnosis not present

## 2021-03-16 DIAGNOSIS — I10 Essential (primary) hypertension: Secondary | ICD-10-CM | POA: Diagnosis present

## 2021-03-16 DIAGNOSIS — K5641 Fecal impaction: Secondary | ICD-10-CM

## 2021-03-16 DIAGNOSIS — J690 Pneumonitis due to inhalation of food and vomit: Secondary | ICD-10-CM

## 2021-03-16 DIAGNOSIS — N179 Acute kidney failure, unspecified: Secondary | ICD-10-CM | POA: Diagnosis not present

## 2021-03-16 DIAGNOSIS — R339 Retention of urine, unspecified: Secondary | ICD-10-CM | POA: Diagnosis present

## 2021-03-16 DIAGNOSIS — Z809 Family history of malignant neoplasm, unspecified: Secondary | ICD-10-CM | POA: Diagnosis not present

## 2021-03-16 DIAGNOSIS — R6521 Severe sepsis with septic shock: Secondary | ICD-10-CM | POA: Diagnosis present

## 2021-03-16 DIAGNOSIS — E872 Acidosis, unspecified: Secondary | ICD-10-CM

## 2021-03-16 DIAGNOSIS — A419 Sepsis, unspecified organism: Principal | ICD-10-CM

## 2021-03-16 DIAGNOSIS — J9601 Acute respiratory failure with hypoxia: Secondary | ICD-10-CM

## 2021-03-16 DIAGNOSIS — Z20822 Contact with and (suspected) exposure to covid-19: Secondary | ICD-10-CM | POA: Diagnosis present

## 2021-03-16 DIAGNOSIS — R739 Hyperglycemia, unspecified: Secondary | ICD-10-CM | POA: Diagnosis not present

## 2021-03-16 DIAGNOSIS — K631 Perforation of intestine (nontraumatic): Secondary | ICD-10-CM

## 2021-03-16 DIAGNOSIS — Z888 Allergy status to other drugs, medicaments and biological substances status: Secondary | ICD-10-CM | POA: Diagnosis not present

## 2021-03-16 DIAGNOSIS — Z880 Allergy status to penicillin: Secondary | ICD-10-CM | POA: Diagnosis not present

## 2021-03-16 DIAGNOSIS — J189 Pneumonia, unspecified organism: Secondary | ICD-10-CM | POA: Diagnosis present

## 2021-03-16 DIAGNOSIS — Z833 Family history of diabetes mellitus: Secondary | ICD-10-CM | POA: Diagnosis not present

## 2021-03-16 DIAGNOSIS — Z8612 Personal history of poliomyelitis: Secondary | ICD-10-CM | POA: Diagnosis not present

## 2021-03-16 DIAGNOSIS — E871 Hypo-osmolality and hyponatremia: Secondary | ICD-10-CM | POA: Diagnosis present

## 2021-03-16 DIAGNOSIS — Z79899 Other long term (current) drug therapy: Secondary | ICD-10-CM | POA: Diagnosis not present

## 2021-03-16 DIAGNOSIS — R7401 Elevation of levels of liver transaminase levels: Secondary | ICD-10-CM | POA: Diagnosis not present

## 2021-03-16 HISTORY — PX: LAPAROTOMY: SHX154

## 2021-03-16 LAB — CBC WITH DIFFERENTIAL/PLATELET
Abs Immature Granulocytes: 0.09 10*3/uL — ABNORMAL HIGH (ref 0.00–0.07)
Abs Immature Granulocytes: 0.12 10*3/uL — ABNORMAL HIGH (ref 0.00–0.07)
Basophils Absolute: 0 10*3/uL (ref 0.0–0.1)
Basophils Absolute: 0 10*3/uL (ref 0.0–0.1)
Basophils Relative: 0 %
Basophils Relative: 0 %
Eosinophils Absolute: 0 10*3/uL (ref 0.0–0.5)
Eosinophils Absolute: 0 10*3/uL (ref 0.0–0.5)
Eosinophils Relative: 0 %
Eosinophils Relative: 0 %
HCT: 41.5 % (ref 39.0–52.0)
HCT: 43.7 % (ref 39.0–52.0)
Hemoglobin: 14 g/dL (ref 13.0–17.0)
Hemoglobin: 14.5 g/dL (ref 13.0–17.0)
Immature Granulocytes: 1 %
Immature Granulocytes: 1 %
Lymphocytes Relative: 5 %
Lymphocytes Relative: 6 %
Lymphs Abs: 0.7 10*3/uL (ref 0.7–4.0)
Lymphs Abs: 0.9 10*3/uL (ref 0.7–4.0)
MCH: 30.8 pg (ref 26.0–34.0)
MCH: 31 pg (ref 26.0–34.0)
MCHC: 32 g/dL (ref 30.0–36.0)
MCHC: 34.9 g/dL (ref 30.0–36.0)
MCV: 88.7 fL (ref 80.0–100.0)
MCV: 96.3 fL (ref 80.0–100.0)
Monocytes Absolute: 0.5 10*3/uL (ref 0.1–1.0)
Monocytes Absolute: 0.8 10*3/uL (ref 0.1–1.0)
Monocytes Relative: 3 %
Monocytes Relative: 5 %
Neutro Abs: 12.7 10*3/uL — ABNORMAL HIGH (ref 1.7–7.7)
Neutro Abs: 13.9 10*3/uL — ABNORMAL HIGH (ref 1.7–7.7)
Neutrophils Relative %: 88 %
Neutrophils Relative %: 91 %
Platelets: 319 10*3/uL (ref 150–400)
Platelets: 376 10*3/uL (ref 150–400)
RBC: 4.54 MIL/uL (ref 4.22–5.81)
RBC: 4.68 MIL/uL (ref 4.22–5.81)
RDW: 12.2 % (ref 11.5–15.5)
RDW: 12.5 % (ref 11.5–15.5)
WBC: 14.1 10*3/uL — ABNORMAL HIGH (ref 4.0–10.5)
WBC: 15.7 10*3/uL — ABNORMAL HIGH (ref 4.0–10.5)
nRBC: 0 % (ref 0.0–0.2)
nRBC: 0 % (ref 0.0–0.2)

## 2021-03-16 LAB — COMPREHENSIVE METABOLIC PANEL
ALT: 44 U/L (ref 0–44)
ALT: 63 U/L — ABNORMAL HIGH (ref 0–44)
AST: 53 U/L — ABNORMAL HIGH (ref 15–41)
AST: 104 U/L — ABNORMAL HIGH (ref 15–41)
Albumin: 4.4 g/dL (ref 3.5–5.0)
Albumin: 3.7 g/dL (ref 3.5–5.0)
Alkaline Phosphatase: 61 U/L (ref 38–126)
Alkaline Phosphatase: 60 U/L (ref 38–126)
Anion gap: 8 (ref 5–15)
Anion gap: 14 (ref 5–15)
BUN: 12 mg/dL (ref 8–23)
BUN: 12 mg/dL (ref 8–23)
CO2: 21 mmol/L — ABNORMAL LOW (ref 22–32)
CO2: 17 mmol/L — ABNORMAL LOW (ref 22–32)
Calcium: 9.1 mg/dL (ref 8.9–10.3)
Calcium: 8.1 mg/dL — ABNORMAL LOW (ref 8.9–10.3)
Chloride: 100 mmol/L (ref 98–111)
Chloride: 105 mmol/L (ref 98–111)
Creatinine, Ser: 0.63 mg/dL (ref 0.61–1.24)
Creatinine, Ser: 1.36 mg/dL — ABNORMAL HIGH (ref 0.61–1.24)
GFR, Estimated: >60 mL/min (ref >60)
GFR, Estimated: 56 mL/min — ABNORMAL LOW (ref >60)
Glucose, Bld: 181 mg/dL — ABNORMAL HIGH (ref 70–99)
Glucose, Bld: 361 mg/dL — ABNORMAL HIGH (ref 70–99)
Potassium: 3.8 mmol/L (ref 3.5–5.1)
Potassium: 3.8 mmol/L (ref 3.5–5.1)
Sodium: 129 mmol/L — ABNORMAL LOW (ref 135–145)
Sodium: 136 mmol/L (ref 135–145)
Total Bilirubin: 1.1 mg/dL (ref 0.3–1.2)
Total Bilirubin: 1.7 mg/dL — ABNORMAL HIGH (ref 0.3–1.2)
Total Protein: 7.4 g/dL (ref 6.5–8.1)
Total Protein: 6.2 g/dL — ABNORMAL LOW (ref 6.5–8.1)

## 2021-03-16 LAB — LIPASE, BLOOD
Lipase: 19 U/L (ref 11–51)
Lipase: 31 U/L (ref 11–51)

## 2021-03-16 LAB — ABO/RH: ABO/RH(D): O POS

## 2021-03-16 LAB — RESP PANEL BY RT-PCR (FLU A&B, COVID) ARPGX2
Influenza A by PCR: NEGATIVE
Influenza B by PCR: NEGATIVE
SARS Coronavirus 2 by RT PCR: NEGATIVE

## 2021-03-16 LAB — URINALYSIS, ROUTINE W REFLEX MICROSCOPIC
Bilirubin Urine: NEGATIVE
Glucose, UA: 50 mg/dL — AB
Hgb urine dipstick: NEGATIVE
Ketones, ur: 20 mg/dL — AB
Leukocytes,Ua: NEGATIVE
Nitrite: NEGATIVE
Protein, ur: NEGATIVE mg/dL
Specific Gravity, Urine: 1.042 — ABNORMAL HIGH (ref 1.005–1.030)
pH: 5 (ref 5.0–8.0)

## 2021-03-16 LAB — POC OCCULT BLOOD, ED: Fecal Occult Bld: NEGATIVE

## 2021-03-16 LAB — BLOOD GAS, ARTERIAL
Acid-base deficit: 20.5 mmol/L — ABNORMAL HIGH (ref 0.0–2.0)
Bicarbonate: 8.6 mmol/L — ABNORMAL LOW (ref 20.0–28.0)
FIO2: 100
O2 Saturation: 83.8 %
Patient temperature: 37
pCO2 arterial: 36.5 mmHg (ref 32.0–48.0)
pH, Arterial: 6.996 — CL (ref 7.350–7.450)
pO2, Arterial: 67.2 mmHg — ABNORMAL LOW (ref 83.0–108.0)

## 2021-03-16 LAB — CBG MONITORING, ED: Glucose-Capillary: 428 mg/dL — ABNORMAL HIGH (ref 70–99)

## 2021-03-16 LAB — TROPONIN I (HIGH SENSITIVITY): Troponin I (High Sensitivity): 32 ng/L — ABNORMAL HIGH (ref <18)

## 2021-03-16 LAB — LACTIC ACID, PLASMA: Lactic Acid, Venous: 10.4 mmol/L (ref 0.5–1.9)

## 2021-03-16 LAB — TYPE AND SCREEN
ABO/RH(D): O POS
Antibody Screen: NEGATIVE

## 2021-03-16 SURGERY — COLONOSCOPY WITH PROPOFOL
Anesthesia: Monitor Anesthesia Care

## 2021-03-16 SURGERY — LAPAROTOMY, EXPLORATORY
Anesthesia: General | Site: Abdomen

## 2021-03-16 MED ORDER — SODIUM CHLORIDE 0.9 % IV SOLN: 2.0000 g | Freq: Three times a day (TID) | INTRAVENOUS | Status: DC

## 2021-03-16 MED ORDER — PHENYLEPHRINE HCL-NACL 20-0.9 MG/250ML-% IV SOLN
INTRAVENOUS | Status: AC
  Filled 2021-03-16: qty 250

## 2021-03-16 MED ORDER — BUPIVACAINE LIPOSOME 1.3 % IJ SUSP
INTRAMUSCULAR | Status: AC
  Filled 2021-03-16: qty 20

## 2021-03-16 MED ORDER — SODIUM CHLORIDE 0.9 % IV BOLUS
1000.0000 mL | Freq: Once | INTRAVENOUS | Status: AC
  Administered 2021-03-16: 1000 mL via INTRAVENOUS

## 2021-03-16 MED ORDER — SODIUM BICARBONATE 8.4 % IV SOLN
INTRAVENOUS | Status: AC
  Filled 2021-03-16: qty 50

## 2021-03-16 MED ORDER — LIDOCAINE HCL (PF) 2 % IJ SOLN
INTRAMUSCULAR | Status: AC
  Filled 2021-03-16: qty 5

## 2021-03-16 MED ORDER — EPINEPHRINE PF 1 MG/ML IJ SOLN
INTRAMUSCULAR | Status: DC | PRN
  Administered 2021-03-16 (×3): 1 mg via INTRAVENOUS

## 2021-03-16 MED ORDER — SODIUM CHLORIDE 0.9 % IV SOLN
2.0000 g | Freq: Once | INTRAVENOUS | Status: AC
  Administered 2021-03-16: 2 g via INTRAVENOUS
  Filled 2021-03-16: qty 2

## 2021-03-16 MED ORDER — PROPOFOL 1000 MG/100ML IV EMUL
0.0000 ug/kg/min | INTRAVENOUS | Status: DC
  Administered 2021-03-16: 5 ug/kg/min via INTRAVENOUS
  Filled 2021-03-16: qty 100

## 2021-03-16 MED ORDER — VASOPRESSIN 20 UNIT/ML IV SOLN
INTRAVENOUS | Status: AC
  Filled 2021-03-16: qty 1

## 2021-03-16 MED ORDER — ROCURONIUM BROMIDE 10 MG/ML (PF) SYRINGE
PREFILLED_SYRINGE | INTRAVENOUS | Status: AC
  Filled 2021-03-16: qty 10

## 2021-03-16 MED ORDER — METRONIDAZOLE 500 MG/100ML IV SOLN
500.0000 mg | Freq: Once | INTRAVENOUS | Status: AC
  Administered 2021-03-16: 500 mg via INTRAVENOUS
  Filled 2021-03-16: qty 100

## 2021-03-16 MED ORDER — ONDANSETRON HCL 4 MG/2ML IJ SOLN
4.0000 mg | Freq: Once | INTRAMUSCULAR | Status: AC
  Administered 2021-03-16: 4 mg via INTRAVENOUS
  Filled 2021-03-16: qty 2

## 2021-03-16 MED ORDER — MIDAZOLAM HCL 2 MG/2ML IJ SOLN: 4.0000 mg | Freq: Once | INTRAMUSCULAR | Status: AC

## 2021-03-16 MED ORDER — NOREPINEPHRINE 4 MG/250ML-% IV SOLN
0.0000 ug/min | INTRAVENOUS | Status: DC
Start: 2021-03-16 — End: 2021-03-17
  Administered 2021-03-16: 30 ug/min via INTRAVENOUS
  Filled 2021-03-16: qty 250

## 2021-03-16 MED ORDER — SODIUM CHLORIDE 0.9 % IV SOLN: 1.0000 g | INTRAVENOUS | Status: DC

## 2021-03-16 MED ORDER — IOHEXOL 300 MG/ML  SOLN
100.0000 mL | Freq: Once | INTRAMUSCULAR | Status: AC | PRN
  Administered 2021-03-16: 100 mL via INTRAVENOUS

## 2021-03-16 MED ORDER — SODIUM BICARBONATE 8.4 % IV SOLN
INTRAVENOUS | Status: DC | PRN
  Administered 2021-03-16 (×2): 50 meq via INTRAVENOUS

## 2021-03-16 MED ORDER — ETOMIDATE 2 MG/ML IV SOLN
INTRAVENOUS | Status: AC
  Filled 2021-03-16: qty 10

## 2021-03-16 MED ORDER — SORBITOL 70 % SOLN
960.0000 mL | TOPICAL_OIL | Freq: Once | ORAL | Status: AC
  Administered 2021-03-16: 960 mL via RECTAL
  Filled 2021-03-16: qty 473

## 2021-03-16 MED ORDER — FENTANYL CITRATE PF 50 MCG/ML IJ SOSY: 50.0000 ug | PREFILLED_SYRINGE | INTRAMUSCULAR | Status: DC | PRN

## 2021-03-16 MED ORDER — NOREPINEPHRINE 4 MG/250ML-% IV SOLN
INTRAVENOUS | Status: AC
  Administered 2021-03-16: 10 ug/min via INTRAVENOUS
  Filled 2021-03-16: qty 250

## 2021-03-16 MED ORDER — VASOPRESSIN 20 UNIT/ML IV SOLN
INTRAVENOUS | Status: DC | PRN
  Administered 2021-03-16 (×2): 5 [IU] via INTRAVENOUS

## 2021-03-16 MED ORDER — FLEET ENEMA 7-19 GM/118ML RE ENEM
1.0000 | ENEMA | Freq: Once | RECTAL | Status: AC
  Administered 2021-03-16: 1 via RECTAL

## 2021-03-16 MED ORDER — SODIUM CHLORIDE 0.9 % IV SOLN: 2.0000 g | INTRAVENOUS | Status: DC

## 2021-03-16 MED ORDER — FENTANYL CITRATE (PF) 250 MCG/5ML IJ SOLN
INTRAMUSCULAR | Status: AC
  Filled 2021-03-16: qty 5

## 2021-03-16 MED ORDER — SODIUM CHLORIDE 0.9 % IR SOLN
Status: DC | PRN
  Administered 2021-03-16: 1000 mL

## 2021-03-16 MED ORDER — EPINEPHRINE 1 MG/10ML IJ SOSY
PREFILLED_SYRINGE | INTRAMUSCULAR | Status: AC
  Filled 2021-03-16: qty 20

## 2021-03-16 MED ORDER — ROCURONIUM BROMIDE 10 MG/ML (PF) SYRINGE
PREFILLED_SYRINGE | INTRAVENOUS | Status: DC | PRN
  Administered 2021-03-16 (×2): 50 mg via INTRAVENOUS

## 2021-03-16 MED ORDER — CHLORHEXIDINE GLUCONATE CLOTH 2 % EX PADS: 6.0000 | MEDICATED_PAD | Freq: Once | CUTANEOUS | Status: DC

## 2021-03-16 MED ORDER — HYDROMORPHONE HCL 1 MG/ML IJ SOLN
1.0000 mg | Freq: Once | INTRAMUSCULAR | Status: AC
  Administered 2021-03-16: 1 mg via INTRAVENOUS
  Filled 2021-03-16: qty 1

## 2021-03-16 MED ORDER — MIDAZOLAM HCL 5 MG/5ML IJ SOLN
INTRAMUSCULAR | Status: AC
  Administered 2021-03-16: 4 mg via INTRAVENOUS
  Filled 2021-03-16: qty 5

## 2021-03-16 MED ORDER — SODIUM CHLORIDE 0.9 % IV SOLN
2.0000 g | Freq: Once | INTRAVENOUS | Status: DC
  Filled 2021-03-16: qty 2

## 2021-03-16 MED ORDER — HYDROMORPHONE HCL 1 MG/ML IJ SOLN
1.0000 mg | Freq: Once | INTRAMUSCULAR | Status: DC
Start: 2021-03-16 — End: 2021-03-17
  Filled 2021-03-16: qty 1

## 2021-03-16 MED ORDER — LACTATED RINGERS IV SOLN: INTRAVENOUS | Status: DC | PRN

## 2021-03-16 MED ORDER — MIDAZOLAM HCL 2 MG/2ML IJ SOLN
INTRAMUSCULAR | Status: AC
  Filled 2021-03-16: qty 2

## 2021-03-16 MED ORDER — SODIUM CHLORIDE 0.9 % IV SOLN: INTRAVENOUS | Status: DC | PRN

## 2021-03-16 SURGICAL SUPPLY — 35 items
BLADE SURG 15 STRL LF DISP TIS (BLADE) ×2 IMPLANT
BLADE SURG 15 STRL SS (BLADE) ×1
CHLORAPREP W/TINT 26 (MISCELLANEOUS) ×3 IMPLANT
CLOTH BEACON ORANGE TIMEOUT ST (SAFETY) ×3 IMPLANT
COVER LIGHT HANDLE STERIS (MISCELLANEOUS) ×6 IMPLANT
DRAPE WARM FLUID 44X44 (DRAPES) ×3 IMPLANT
ELECT BLADE 6 FLAT ULTRCLN (ELECTRODE) ×3 IMPLANT
ELECT REM PT RETURN 9FT ADLT (ELECTROSURGICAL) ×3
ELECTRODE REM PT RTRN 9FT ADLT (ELECTROSURGICAL) ×2 IMPLANT
GAUZE SPONGE 4X4 12PLY STRL (GAUZE/BANDAGES/DRESSINGS) ×3 IMPLANT
GLOVE SURG ENC MOIS LTX SZ6.5 (GLOVE) ×12 IMPLANT
GLOVE SURG POLYISO LF SZ7.5 (GLOVE) ×12 IMPLANT
GLOVE SURG UNDER POLY LF SZ7 (GLOVE) ×3 IMPLANT
GOWN STRL REUS W/TWL LRG LVL3 (GOWN DISPOSABLE) ×12 IMPLANT
HANDLE SUCTION POOLE (INSTRUMENTS) ×2 IMPLANT
INST SET MAJOR GENERAL (KITS) ×3 IMPLANT
KIT TURNOVER KIT A (KITS) ×3 IMPLANT
LIGASURE IMPACT 36 18CM CVD LR (INSTRUMENTS) ×3 IMPLANT
MANIFOLD NEPTUNE II (INSTRUMENTS) ×3 IMPLANT
NEEDLE HYPO 18GX1.5 BLUNT FILL (NEEDLE) ×3 IMPLANT
NEEDLE HYPO 21X1.5 SAFETY (NEEDLE) ×3 IMPLANT
NS IRRIG 1000ML POUR BTL (IV SOLUTION) ×6 IMPLANT
PACK ABDOMINAL MAJOR (CUSTOM PROCEDURE TRAY) ×3 IMPLANT
PAD ARMBOARD 7.5X6 YLW CONV (MISCELLANEOUS) ×3 IMPLANT
RELOAD PROXIMATE 75MM BLUE (ENDOMECHANICALS) ×3 IMPLANT
SEALER TISSUE X1 CVD JAW (INSTRUMENTS) ×3 IMPLANT
SET BASIN LINEN APH (SET/KITS/TRAYS/PACK) ×3 IMPLANT
SPONGE T-LAP 18X18 ~~LOC~~+RFID (SPONGE) ×6 IMPLANT
STAPLER PROXIMATE 75MM BLUE (STAPLE) ×3 IMPLANT
STAPLER VISISTAT (STAPLE) ×3 IMPLANT
SUCTION POOLE HANDLE (INSTRUMENTS) ×3
SUT CHROMIC 0 SH (SUTURE) ×3 IMPLANT
SUT NOVA NAB GS-26 0 60 (SUTURE) ×3 IMPLANT
SUT SILK 3 0 SH CR/8 (SUTURE) ×3 IMPLANT
SYR 20ML LL LF (SYRINGE) ×6 IMPLANT

## 2021-03-17 MED FILL — Medication: Qty: 1 | Status: AC

## 2021-03-21 ENCOUNTER — Encounter (HOSPITAL_COMMUNITY): Payer: Self-pay | Admitting: General Surgery

## 2021-03-21 LAB — CULTURE, BLOOD (ROUTINE X 2)

## 2021-03-22 LAB — CULTURE, BLOOD (ROUTINE X 2)
Culture: NO GROWTH
Special Requests: ADEQUATE

## 2021-03-24 NOTE — H&P (Signed)
History and Physical  Christopher BueDavid L Aguilar GNF:[id number] DOB: 07/29/1950 DOA: 03/17/2021   PCP: Suzan Slickucker, Alethea Y, MD   Patient coming from: Home  Chief Complaint: abdominal pain  HPI:  Christopher Aguilar is a 70 y.o. male with medical history of hypertension and GERD.  The patient presented to the emergency department on 02/24/2021 with abdominal pain and difficulty urinating and having a bowel movement.  He has been following GI, Dr. Karilyn Cotaehman, and he was supposed to be scheduled for colonoscopy on 2020/11/17.  The patient was noted to have urinary retention with 700 cc urine after Foley catheter was placed on 02/23/2021.  The Foley catheter was removed prior to his discharge.  DRE was performed and did not reveal any masses but noted stool in the rectum.  The patient was discharged home with instructions to start his colon prep for his colonoscopy. The patient took his colon prep and enemas at home with very little stool output.  He only reported a small amount of liquid and hard stool.  He did not have any fevers, chills, chest pain, shortness of breath, hematochezia, melena. he presented back to the ED on 2020/11/17 AM with rectal pain and abdominal pain and distention with nausea. He was noted to have a BM on arrival to triage.    While in ED patient attempted multiple times to have BM, stated that each time he attempted to get off the toilet, stool leaked from his rectum,  became unresponsive on the toilet with agonal respirations, diaphoresis and vomiting. Patient was found to have abdomen that was very distended and hard. He was intubated thereafter due to failure to maintain his airway. Patient was hypotensive in the 70s, sepsis protocol antibiotics and fluid resuscitation was given. Levophed was also initiated due to continued hypotension. Abd xray showed no obvious free air, bowel gas pattern stable, Dr. Henreitta LeberBridges consulted due to concerns for perforation of intra-abdominal viscus. Dr. Henreitta LeberBridges  attempted to disimpact patient at bedside, however OG tube was not able to be placed by ED staff or Dr. Henreitta LeberBridges.   At the time of my evaluation in the emergency department, patient was afebrile but hypotensive on Levophed drip.  The patient was intubated ventilated.  CT abdomen showed large volume stool ball within low rectum significantly expanding the rectum, mild inflammatory change consistent with stercoral proctitis, large bowel obstruction. Repeat CT Abd/Pelvis shows extensive pneumatosis intestinalis, portal venous gas, pneumoperitoneum concerning for interval development of diffuse ischemic bowel with perforation, suspected due to profound SMA ischemia. Findings consistent with stercoral colitis, unlikely to be the sole culprit of other findings.   The case was discussed with Dr. Henreitta LeberBridges who plans to take the patient to the OR for emergent laparotomy.  Cefepime and metronidazole were given.  As I was seeing patient, he was being transported to OR for laparotomy.  Assessment/Plan: Septic shock -present on admission -presentes with tachycardia, leukocytosis, hypotension -secondary to ischemic bowel, perforated viscus -lactic acid 10.4 -start IV zosyn -continue levophed -continue IVF -give 2 amps bicarbonate -start bicarbonate drip -UA--no pyruia -follow blood cultures -central line placed by Dr. Henreitta LeberBridges -start solucortef  Ischemic bowel/perforated bowel -08-Jul-2021 CT abd--new extensive pneumatosis intestinalis with portal venous gas and pneumoperitoneum concerning for interval development diffuse bowel ischemic bowel with perforation -case discussed with general surgery (Dr. 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   Past Surgical History:  Procedure Laterality Date   BIOPSY  03/20/2018   Procedure: BIOPSY;  Surgeon: Malissa Hippo, MD;  [facility];  Service: Endoscopy;;  gastric   CHOLECYSTECTOMY     ESOPHAGOGASTRODUODENOSCOPY N/A 03/20/2018   Procedure: ESOPHAGOGASTRODUODENOSCOPY (EGD);  Surgeon: Malissa Hippo, MD;  Location: [facility];  Service: Endoscopy;  Laterality: N/A;  325   GALLBLADDER SURGERY  03/2015   OPEN REDUCTION INTERNAL FIXATION (ORIF) DISTAL RADIAL FRACTURE Left 08/23/2016   Procedure: OPEN REDUCTION INTERNAL FIXATION (ORIF) LEFT DISTAL RADIUS FRACTURE;  Surgeon: Tarry Kos, MD;  Location: [facility];  Service: Orthopedics;  Laterality: Left;   POLYPECTOMY  03/20/2018   Procedure: POLYPECTOMY;  Surgeon: Malissa Hippo, MD;  Location: [facility];  Service: Endoscopy;;  gastric    Social History:  reports that he has never smoked. He has never used smokeless tobacco. He reports that he does not drink alcohol and does not use drugs.   Family History  Problem Relation Age of Onset   Diabetes Mother    Hypertension Mother    Cancer Sister    Diabetes Brother    Hypertension Brother      Allergies  Allergen Reactions   Lipitor [Atorvastatin]     This statin and all statins patient is intolerant to. Causes increase in Liver Enzymes.   Penicillins Hives    Has patient had a PCN reaction causing immediate rash,  facial/tongue/throat swelling, SOB or lightheadedness with hypotension: Yes Has patient had a PCN reaction causing severe rash involving mucus membranes or skin necrosis: No Has patient had a PCN reaction that required hospitalization No Has patient had a PCN reaction occurring within the last 10 years: No If all of the above answers are "NO", then may proceed with Cephalosporin use.      Prior to Admission medications   Medication Sig Start Date End Date Taking? Authorizing Provider  acetaminophen (TYLENOL) 650 MG CR tablet Take 650 mg by mouth every 8 (eight) hours as needed for pain.   Yes [provider]  Alum Hydroxide-Mag Carbonate (GAVISCON PO) Take 1 tablet by mouth in the morning, at noon, and at bedtime.   Yes [provider]  Ascorbic Acid (VITAMIN C) 1000 MG tablet Take 1,000 mg by mouth daily.   Yes [provider]  baclofen (LIORESAL) 10 MG tablet Take 10 mg by mouth 2 (two) times daily as needed for muscle spasms.   Yes [provider]  calcium carbonate (TUMS - DOSED IN MG ELEMENTAL CALCIUM) 500 MG chewable tablet Chew 1 tablet by mouth daily as needed for heartburn or indigestion.   Yes [provider]  Cholecalciferol (VITAMIN D) 125 MCG (5000 UT) CAPS Take 5,000 Units by mouth daily.   Yes [provider]  Cyanocobalamin (VITAMIN B12) 1000 MCG TBCR Take 1,000 mcg by mouth daily.   Yes  [provider]  docusate sodium (COLACE) 100 MG capsule Take 100 mg by mouth 2 (two) times daily.   Yes [provider]  Doxylamine Succinate, Sleep, (UNISOM PO) Take 50 mg by mouth at bedtime.   Yes [provider]  famotidine (PEPCID) 10 MG tablet Take 10 mg by mouth at bedtime.   Yes [provider]  hydrocortisone (ANUSOL-HC) 2.5 % rectal cream Place 1 application rectally 2 (two) times daily. Patient taking differently: Place 1 application rectally 2 (two) times daily as needed for hemorrhoids.  10/12/20  Yes Rehman, Joline Maxcy, MD  linaclotide (LINZESS) 72 MCG capsule Take 1 capsule (72 mcg total) by mouth daily before breakfast. 03/01/21  Yes Rehman, Joline Maxcy, MD  lisinopril (PRINIVIL,ZESTRIL) 20 MG tablet Take 20 mg by mouth in the morning.   Yes [provider]  loratadine (CLARITIN) 10 MG tablet Take 10 mg by mouth daily as needed for allergies.   Yes [provider]  Melatonin 10 MG TABS Take 10 mg by mouth at bedtime.   Yes [provider]  Multiple Vitamin (MULTIVITAMIN WITH MINERALS) TABS tablet Take 1 tablet by mouth daily.   Yes [provider]  pantoprazole (PROTONIX) 40 MG tablet Take 1 tablet (40 mg total) by mouth daily before breakfast. 09/28/20  Yes Rehman, Joline Maxcy, MD  Probiotic Product (PHILLIPS COLON HEALTH PO) Take 1 capsule by mouth daily.   Yes [provider]  simethicone (MYLICON) 125 MG chewable tablet Chew 125 mg by mouth as needed for flatulence.   Yes [provider]  hydrocortisone (ANUSOL-HC) 25 MG suppository Place 1 suppository (25 mg total) rectally at bedtime. Patient not taking: Reported on 03/11/2021 03/01/21   Malissa Hippo, MD  hyoscyamine (LEVSIN SL) 0.125 MG SL tablet Place 1 tablet (0.125 mg total) under the tongue 3 (three) times daily as needed. 03/10/21   Malissa Hippo, MD    Review of Systems:  Unobtainable due to acute encephalopathy  Physical Exam: Vitals:   03/20/2021 0745 03/18/2021 0815 03/21/2021 0830 03/20/2021 0845  BP: 121/86 96/70 106/76 92/69  Pulse: 100 84 87 88  Resp: (!) 23 (!) 21 (!) 24 (!) 30  Temp:      TempSrc:      SpO2: 91% 100% 99% 95%  Weight:      Height:       General:  intubated and sedated Head/Eye: No conjunctival hemorrhage, no icterus, [place]/AT, No nystagmus ENT:  No icterus,  No thrush, good dentition Neck:  No masses, no lymphadenpathy, no bruits CV:  RRR, no rub, no gallop, no S3 Lung:  bibasilar rales. No wheeze Abdomen: soft/NT, diminished BS,  +distended,+peritoneal signs Ext: No cyanosis, No rashes, No petechiae, No lymphangitis, No edema   Labs on Admission:  Basic Metabolic Panel: Recent Labs  Lab 03/14/21 1319 03/18/2021 0030 03/19/2021 0715  NA 132* 129* 136  K 4.2 3.8 3.8  CL 101 100 105  CO2 25 21* 17*  GLUCOSE 95 181* 361*  BUN CREATININE 0.70 0.63 1.36*  CALCIUM 9.2 9.1 8.1*   Liver Function Tests: Recent Labs  Lab 03/21/2021 0030 02/21/2021 0715  AST 53* 104*  ALT 44 63*  ALKPHOS 61 60  BILITOT 1.1 1.7*  PROT 7.4 6.2*  ALBUMIN 4.4 3.7   Recent Labs  Lab 03/04/2021 0030 03/10/2021 0715  LIPASE 19 31   No results for input(s): AMMONIA in the last 168 hours. CBC: Recent Labs  Lab 03/11/2021  0030 03/03/2021 0715  WBC 15.7* 14.1*  NEUTROABS 13.9* 12.7*  HGB 14.5 14.0  HCT 41.5 43.7  MCV 88.7 96.3  PLT 319 376   Coagulation Profile: No results for input(s): INR, PROTIME in the last 168 hours. Cardiac Enzymes: No results for input(s): CKTOTAL, CKMB, CKMBINDEX, TROPONINI in the last 168 hours. BNP: Invalid input(s): POCBNP CBG: No results for input(s): GLUCAP in the last 168 hours. Urine analysis:    Component Value Date/Time   COLORURINE AMBER (A) 03/03/2021 0020   APPEARANCEUR CLEAR 03/12/2021 0020   LABSPEC 1.042 (H) 03/20/2021 0020   PHURINE 5.0 03/04/2021 0020   GLUCOSEU 50 (A) 03/06/2021 0020   HGBUR NEGATIVE 03/05/2021 0020   BILIRUBINUR NEGATIVE 02/22/2021 0020   KETONESUR 20 (A) 03/15/2021 0020   PROTEINUR NEGATIVE 03/09/2021 0020   NITRITE NEGATIVE 03/21/2021 0020   LEUKOCYTESUR NEGATIVE 03/12/2021 0020   Sepsis Labs: (procalcitonin:4,lacticidven:4) ) Recent Results (from the past 240 hour(s))  Resp Panel by RT-PCR (Flu A&B, Covid) Nasopharyngeal Swab     Status: None   Collection Time: 03/04/2021 12:24 AM   Specimen: Nasopharyngeal Swab; Nasopharyngeal(NP) swabs in vial transport medium  Result Value Ref Range Status   SARS Coronavirus 2 by RT PCR NEGATIVE  NEGATIVE Final    Comment: (NOTE) SARS-CoV-2 target nucleic acids are NOT DETECTED.  The SARS-CoV-2 RNA is generally detectable in upper respiratory specimens during the acute phase of infection. The lowest concentration of SARS-CoV-2 viral copies this assay can detect is 138 copies/mL. A negative result does not preclude SARS-Cov-2 infection and should not be used as the sole basis for treatment or other patient management decisions. A negative result may occur with  improper specimen collection/handling, submission of specimen other than nasopharyngeal swab, presence of viral mutation(s) within the areas targeted by this assay, and inadequate number of viral copies(<138 copies/mL). A negative result must be combined with clinical observations, patient history, and epidemiological information. The expected result is Negative.  Fact Sheet for Patients:  BloggerCourse.com  Fact Sheet for Healthcare Providers:  SeriousBroker.it  This test is no t yet approved or cleared by the [place] FDA and  has been authorized for detection and/or diagnosis of SARS-CoV-2 by FDA under an Emergency Use Authorization (EUA). This EUA will remain  in effect (meaning this test can be used) for the duration of the COVID-19 declaration under Section 564(b)(1) of the Act, 21 U.S.C.section 360bbb-3(b)(1), unless the authorization is terminated  or revoked sooner.       Influenza A by PCR NEGATIVE NEGATIVE Final   Influenza B by PCR NEGATIVE NEGATIVE Final    Comment: (NOTE) The Xpert Xpress SARS-CoV-2/FLU/RSV plus assay is intended as an aid in the diagnosis of influenza from Nasopharyngeal swab specimens and should not be used as a sole basis for treatment. Nasal washings and aspirates are unacceptable for Xpert Xpress SARS-CoV-2/FLU/RSV testing.  Fact Sheet for Patients: BloggerCourse.com  Fact Sheet for Healthcare  Providers: SeriousBroker.it  This test is not yet approved or cleared by the [place] FDA and has been authorized for detection and/or diagnosis of SARS-CoV-2 by FDA under an Emergency Use Authorization (EUA). This EUA will remain in effect (meaning this test can be used) for the duration of the COVID-19 declaration under Section 564(b)(1) of the Act, 21 U.S.C. section 360bbb-3(b)(1), unless the authorization is terminated or revoked.  Performed at [facility], [address] [place], [place] [address]   Blood culture (routine x 2)     Status: None (Preliminary  result)   Collection Time: 03/14/2021  7:15 AM   Specimen: BLOOD  Result Value Ref Range Status   Specimen Description BLOOD LEFT ANTECUBITAL  Final   Special Requests   Final    BOTTLES DRAWN AEROBIC AND ANAEROBIC Blood Culture adequate volume Performed at [facility], [address], [place], [place] [address]    Culture PENDING  Incomplete   Report Status PENDING  Incomplete     Radiological Exams on Admission: CT ABDOMEN PELVIS W CONTRAST  Result Date: 03/03/2021 CLINICAL DATA:  Abdominal distension, constipation, anal leakage following bowel prep for colonoscopy. EXAM: CT ABDOMEN AND PELVIS WITH CONTRAST TECHNIQUE: Multidetector CT imaging of the abdomen and pelvis was performed using the standard protocol following bolus administration of intravenous contrast. CONTRAST:  OMNIPAQUE IOHEXOL 300 MG/ML  SOLN COMPARISON:  None. FINDINGS: Lower chest: The visualized lung bases are clear. At least moderate right coronary artery calcification. Global cardiac size within normal limits. Moderate hiatal hernia. Hepatobiliary: Tiny cyst within the left hepatic lobe. Liver otherwise unremarkable. Cholecystectomy has been performed. No intra or extrahepatic biliary ductal dilation. Pancreas: Unremarkable Spleen: Unremarkable Adrenals/Urinary Tract: The adrenal glands are unremarkable. The kidneys are  normal in size and position. Multiple tiny hypodensities are seen within the kidneys bilaterally likely representing tiny cortical cysts, but are too small to accurately characterize. No nephro or urolithiasis. There is minimal right hydronephrosis. The bladder is distended. There is marked mass effect upon the base of the bladder secondary to the extensive, expansile stool within the distal rectum. The bladder is displaced anteriorly and superiorly. Stomach/Bowel: There is a large amount of stool within the rectal vault which expands the low rectum with associated rectal wall thickening and mild perirectal inflammatory stranding in keeping with superimposed stercoral proctitis. The large stool ball results in at least partial large bowel obstruction with mild dilation of the more proximal descending and sigmoid colon. Liquid stool is seen within the sigmoid colon and there is multiple fluid-filled loops of nondilated large and small bowel in keeping with the given history of bowel preparation. The stomach, small bowel, and large bowel are otherwise unremarkable. Appendix normal. No free intraperitoneal gas or fluid. Vascular/Lymphatic: Mild aortoiliac atherosclerotic calcification. No aortic aneurysm. No pathologic adenopathy within the abdomen and pelvis. Reproductive: Prostate is unremarkable. Other: Tiny fat containing umbilical hernia. Musculoskeletal: No acute bone abnormality. No lytic or blastic bone lesion. Degenerative changes are seen within the lumbar spine. IMPRESSION: Large volume stool ball within the low rectum significantly expands the rectum which demonstrates mild inflammatory change in keeping with stercoral proctitis. Resultant partial large bowel obstruction. Marked mass effect upon the base of the bladder likely results in at least mild bladder outlet obstruction with distention of the bladder and minimal right hydronephrosis. Aortic Atherosclerosis (ICD10-I70.0). Electronically Signed   By:  Helyn Numbers M.D.   On: 03/05/2021 01:20   DG Chest Portable 1 View  Result Date: 03/05/2021 CLINICAL DATA:  70 year old male respiratory arrest. EXAM: PORTABLE CHEST 1 VIEW COMPARISON:  CT Abdomen and Pelvis 0059 hours today. FINDINGS: Portable AP supine view at 0650 hours. Intubated, with endotracheal tube tip at the carina or proximal right mainstem bronchus. Elevated diaphragm with low lung volumes. Mild streaky bilateral lower lung opacity most resembles atelectasis. Normal cardiac size and mediastinal contours. No pneumothorax or pleural effusion is evident on this supine view. Continued abnormal visible bowel-gas pattern. IMPRESSION: 1. Endotracheal tube tip at the carina or proximal right mainstem bronchus. Retract 2 cm for optimal  placement. 2. Low lung volumes with mild atelectasis. 3. Continued dilated gas-filled bowel loops in the visible abdomen. Electronically Signed   By: Odessa Fleming M.D.   On: 2021/04/15 07:19   DG Abd Portable 1 View  Result Date: 04-15-2021 CLINICAL DATA:  70 year old male with respiratory arrest. Distal large bowel obstruction, fecal impaction on CT Abdomen and Pelvis 0059 hours today. EXAM: PORTABLE ABDOMEN - 1 VIEW COMPARISON:  CT Abdomen and Pelvis 0059 hours today. FINDINGS: Portable AP supine view at 0648 hours. Continued gas distended bowel in the abdomen and retained stool projecting over the pelvis. The patient is mildly rotated to the right. Gas pattern not significantly changed. No definite pneumoperitoneum on these supine views. No acute osseous abnormality identified. IMPRESSION: Stable gas distended bowel loops and evidence of distal fecal impaction from the CT 0059 hours today. Electronically Signed   By: Odessa Fleming M.D.   On: 04/15/21 07:21   CT CHEST ABDOMEN PELVIS WO CONTRAST  Result Date: 15-Apr-2021 CLINICAL DATA:  Respiratory illness, rectal pain, abdominal distension, patient became unresponsive EXAM: CT CHEST, ABDOMEN AND PELVIS WITHOUT CONTRAST  TECHNIQUE: Multidetector CT imaging of the chest, abdomen and pelvis was performed following the standard protocol without IV contrast. COMPARISON:  CT abdomen/pelvis obtained earlier the same day FINDINGS: CT CHEST FINDINGS Lines/tubes: The endotracheal tube terminates in the midthoracic trachea. Cardiovascular: The heart size is stable. There is no pericardial effusion. Mediastinum/Nodes: The imaged thyroid is unremarkable. There is no mediastinal or axillary lymphadenopathy. There is fluid throughout the esophagus. Lungs/Pleura: The trachea and central airways are patent. There are bibasilar consolidations, worsened since the CT obtained earlier the same day. Patchy opacities in the aerated portions of the lung bases may reflect aspiration given time course. There is no pleural effusion or pneumothorax. Musculoskeletal: There is no acute osseous abnormality. CT ABDOMEN PELVIS FINDINGS Hepatobiliary: There is extensive portal venous gas. The gallbladder is surgically absent. Pancreas: Unremarkable. Spleen: The splenic parenchyma is unremarkable. Vascular gas is noted within the spleen. Adrenals/Urinary Tract: The adrenals are unremarkable. A few small hypodense lesions in the kidneys likely reflects cysts, suboptimally evaluated in the absence of intravenous contrast. There is no new focal lesion. There is no hydronephrosis or hydroureter. Contrast is seen in the collecting system. The bladder is decompressed by Foley catheter. There are no new focal lesions Stomach/Bowel: The stomach is grossly unremarkable. There are multiple loops of dilated large and small bowel with large bowel measuring up to 7.9 cm in the right hemiabdomen and small bowel measuring up to approximately 3.4 cm. There is a large stool ball in the rectum with surrounding inflammatory changes, stable. There is new extensive pneumatosis intestinalis. Vascular/Lymphatic: The abdominal aorta is nonaneurysmal. There is extensive gas throughout the  venous vasculature of the abdomen and pelvis. There is no abdominal or pelvic lymphadenopathy. Reproductive: The prostate and seminal vesicles are grossly unremarkable. Other: There is new pneumoperitoneum consistent with bowel perforation. The site of perforation is not definitively identified. Musculoskeletal: There is no acute osseous abnormality. IMPRESSION: 1. 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 2. Findings consistent with stercoral colitis, unlikely to be the sole culprit of the above findings. 3. New bibasilar consolidation likely reflecting atelectasis. Patchy opacities in the aerated portions of the lung bases likely reflect aspiration. Critical Value/emergent results were called by telephone at the time of interpretation on 2021/04/15 at 9:02 am to provider  Bethann Berkshire, who verbally acknowledged these results. Electronically Signed   By: Lesia Hausen M.D.   On: 04-02-21 09:24    EKG: Independently reviewed. Sinus, RBBB    Time spent:60 minutes Code Status:   FULL Family Communication: son and spouse updated Disposition Plan: expect 3-4 day hospitalization Consults called: PCCM, general surgery, GI DVT Prophylaxis: [place] Lovenox  Awab Abebe, DO  [facility] Pager [phone]  If 7PM-7AM, please contact night-coverage [email or web address] Password [facility] 2021-04-02, 10:27 AM

## 2021-03-24 NOTE — ED Notes (Signed)
Dr Henreitta Leber successful at removing moderate amount of stool and air within the bowel during disimpaction. Abdominal distention slightly decreased at this time. Plan to transport to ct for imaging of free air.

## 2021-03-24 NOTE — Anesthesia Preprocedure Evaluation (Addendum)
Anesthesia Evaluation  Patient identified by MRN, date of birth, ID band Patient unresponsive  General Assessment Comment:Emergency EX LAP, patient intubated on vent  Reviewed: Allergy & Precautions, NPO status , Patient's Chart, lab work & pertinent test results  History of Anesthesia Complications Negative for: history of anesthetic complications  Airway Mallampati: Intubated       Dental  (+) Dental Advisory Given Patient intubated, on vent:   Pulmonary  On vent  Intubated on vent   + decreased breath sounds+ wheezing unstable rales+ intubated    Cardiovascular Exercise Tolerance: Good hypertension, Pt. on medications  Rhythm:Regular Rate:Tachycardia     Neuro/Psych negative neurological ROS  negative psych ROS   GI/Hepatic GERD  Medicated,Perforated colon   Endo/Other  negative endocrine ROS  Renal/GU      Musculoskeletal negative musculoskeletal ROS (+)   Abdominal (+) + obese,  Abdomen: rigid.    Peds  Hematology negative hematology ROS (+)   Anesthesia Other Findings Sepsis, shock, Hypotensive, systolic blood pressure in  50s and 60s, oxygen saturations in 50s ABG - severe acidosis,  IMPRESSION: 1. 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 2. Findings consistent with stercoral colitis, unlikely to be the sole culprit of the above findings. 3. New bibasilar consolidation likely reflecting atelectasis. Patchy opacities in the aerated portions of the lung bases likely reflect aspiration.  Critical Value/emergent results were called by telephone at the time of interpretation on 2021/04/13 at 9:02 am to provider Bethann Berkshire, who verbally acknowledged these results.   Electronically Signed   By: Lesia Hausen M.D.   On: 04/13/21 09:24   Reproductive/Obstetrics                           Anesthesia Physical Anesthesia Plan  ASA: 5 and emergent  Anesthesia Plan: General   Post-op Pain Management:    Induction: Intravenous  PONV Risk Score and Plan:   Airway Management Planned: Oral ETT  Additional Equipment: Arterial line and CVP  Intra-op Plan:   Post-operative Plan: Post-operative intubation/ventilation  Informed Consent: I have reviewed the patients History and Physical, chart, labs and discussed the procedure including the risks, benefits and alternatives for the proposed anesthesia with the patient or authorized representative who has indicated his/her understanding and acceptance.     Dental advisory given  Plan Discussed with: CRNA and Surgeon  Anesthesia Plan Comments:        Anesthesia Quick Evaluation

## 2021-03-24 NOTE — ED Notes (Signed)
Pt has completed 4L of sodium chloride boluses a this time. Difficulty getting scanner to work properly due to low battery. Pressures continue to be soft. Pressure bag in place to assist with bolus deliver

## 2021-03-24 NOTE — ED Notes (Signed)
Have attempted several times to give medications and obtain vital signs however pt remains in the restroom and states he cannot come out just yet that he will call when he is finished.

## 2021-03-24 NOTE — ED Notes (Signed)
One amp of sodium bicarb given IV push per verbal stat order from Onalee Hua Tat MD due to metabolic acidosis

## 2021-03-24 NOTE — ED Provider Notes (Signed)
[facility] EMERGENCY DEPARTMENT Provider Note   CSN: [id number] Arrival date & time: 03/12/2021  2241     History Chief Complaint  Patient presents with   Abdominal Pain    Christopher Aguilar is a 70 y.o. male.  Patient has a history of hypertension and GERD.  Here with concern for constipation and rectal pain.  He was seen yesterday for urinary retention given a Foley catheter which was removed before discharge.  States he is only urinated 1 time today.  He states he has severe abdominal pain and rectal pain.  He started taking his bowel prep for colonoscopy about 2 PM and has only been passing small amounts of liquid and hard stool. He has used several enemas as well without success.  States EMS was called because he was having a lot of pain sitting on the toilet for more than 6 hours with abdominal pain and rectal pain.  Has a history of chronic constipation and supposed to have a colonoscopy at 8 AM today.  He has not had any vomiting but has had nausea.  No chest pain or shortness of breath.  Believes that he is emptying his bladder at this time.  Denies any black or bloody stools.  Denies any chest pain.  No previous abdominal surgeries. He was apparently able to have a large bowel movement on arrival in triage.  The history is provided by the patient.  Abdominal Pain Associated symptoms: nausea   Associated symptoms: no chest pain, no cough, no dysuria, no fever, no shortness of breath and no vomiting       Past Medical History:  Diagnosis Date   GERD (gastroesophageal reflux disease)    Hypertension    Polio    as a child, has a very mild limp.    Patient Active Problem List   Diagnosis Date Noted   Rectal pain 03/10/2021   Flatulence 03/10/2021   GERD (gastroesophageal reflux disease) 09/23/2019   Chronic constipation 09/23/2019   Upper abdominal pain 03/15/2018   Nausea without vomiting 03/15/2018   Closed fracture of left distal radius    Closed fracture of left  distal radius and ulna, initial encounter 08/17/2016    Past Surgical History:  Procedure Laterality Date   BIOPSY  03/20/2018   Procedure: BIOPSY;  Surgeon: Rogene Houston, MD;  [facility]: [facility];  Service: Endoscopy;;  gastric   CHOLECYSTECTOMY     ESOPHAGOGASTRODUODENOSCOPY N/A 03/20/2018   Procedure: ESOPHAGOGASTRODUODENOSCOPY (EGD);  Surgeon: Rogene Houston, MD;  Location: [facility];  Service: Endoscopy;  Laterality: N/A;  325   GALLBLADDER SURGERY  03/2015   OPEN REDUCTION INTERNAL FIXATION (ORIF) DISTAL RADIAL FRACTURE Left 08/23/2016   Procedure: OPEN REDUCTION INTERNAL FIXATION (ORIF) LEFT DISTAL RADIUS FRACTURE;  Surgeon: Leandrew Koyanagi, MD;  Location: [facility];  Service: Orthopedics;  Laterality: Left;   POLYPECTOMY  03/20/2018   Procedure: POLYPECTOMY;  Surgeon: Rogene Houston, MD;  Location: [facility];  Service: Endoscopy;;  gastric        Family History  Problem Relation Age of Onset   Diabetes Mother    Hypertension Mother    Cancer Sister    Diabetes Brother    Hypertension Brother     Social History   Tobacco Use   Smoking status: Never   Smokeless tobacco: Never  Vaping Use   Vaping Use: Never used  Substance Use Topics   Alcohol use: No   Drug use: No  Home Medications Prior to Admission medications   Medication Sig Start Date End Date Taking? Authorizing Provider  acetaminophen (TYLENOL) 650 MG CR tablet Take 650 mg by mouth every 8 (eight) hours as needed for pain.    [provider]  Alum Hydroxide-Mag Carbonate (GAVISCON PO) Take 1 tablet by mouth in the morning, at noon, and at bedtime.    [provider]  Ascorbic Acid (VITAMIN C) 1000 MG tablet Take 1,000 mg by mouth daily.    [provider]  baclofen (LIORESAL) 10 MG tablet Take 10 mg by mouth 2 (two) times daily as needed for muscle spasms.    [provider]  calcium carbonate (TUMS - DOSED IN MG ELEMENTAL CALCIUM) 500 MG  chewable tablet Chew 1 tablet by mouth daily as needed for heartburn or indigestion.    [provider]  Cholecalciferol (VITAMIN D) 125 MCG (5000 UT) CAPS Take 5,000 Units by mouth daily.    [provider]  Cyanocobalamin (VITAMIN B12) 1000 MCG TBCR Take 1,000 mcg by mouth daily.    [provider]  docusate sodium (COLACE) 100 MG capsule Take 100 mg by mouth 2 (two) times daily.    [provider]  Doxylamine Succinate, Sleep, (UNISOM PO) Take 50 mg by mouth at bedtime.    [provider]  famotidine (PEPCID) 10 MG tablet Take 10 mg by mouth at bedtime.    [provider]  hydrocortisone (ANUSOL-HC) 2.5 % rectal cream Place 1 application rectally 2 (two) times daily. Patient taking differently: Place 1 application rectally 2 (two) times daily as needed for hemorrhoids. 10/12/20   Rogene Houston, MD  hydrocortisone (ANUSOL-HC) 25 MG suppository Place 1 suppository (25 mg total) rectally at bedtime. 03/01/21   Rehman, Mechele Dawley, MD  hyoscyamine (LEVSIN SL) 0.125 MG SL tablet Place 1 tablet (0.125 mg total) under the tongue 3 (three) times daily as needed. 03/10/21   Rogene Houston, MD  linaclotide (LINZESS) 72 MCG capsule Take 1 capsule (72 mcg total) by mouth daily before breakfast. 03/01/21   Rehman, Mechele Dawley, MD  lisinopril (PRINIVIL,ZESTRIL) 20 MG tablet Take 20 mg by mouth in the morning.    [provider]  loratadine (CLARITIN) 10 MG tablet Take 10 mg by mouth daily as needed for allergies.    [provider]  Melatonin 10 MG TABS Take 10 mg by mouth at bedtime. One qhs    [provider]  Multiple Vitamin (MULTIVITAMIN WITH MINERALS) TABS tablet Take 1 tablet by mouth daily.    [provider]  pantoprazole (PROTONIX) 40 MG tablet Take 1 tablet (40 mg total) by mouth daily before breakfast. 09/28/20   Rehman, Mechele Dawley, MD  Probiotic Product ([place] PO) Take 1 capsule by mouth daily.     [provider]  simethicone (MYLICON) 694 MG chewable tablet Chew 125 mg by mouth as needed for flatulence.    [provider]    Allergies    Lipitor [atorvastatin] and Penicillins  Review of Systems   Review of Systems  Constitutional:  Negative for activity change, appetite change and fever.  HENT:  Negative for congestion and rhinorrhea.   Respiratory:  Negative for cough, chest tightness and shortness of breath.   Cardiovascular:  Negative for chest pain.  Gastrointestinal:  Positive for abdominal pain and nausea. Negative for vomiting.  Genitourinary:  Negative for dysuria and penile pain.  Musculoskeletal:  Negative for arthralgias and myalgias.  Skin:  Negative for rash.  Neurological:  Negative for dizziness, weakness and headaches.   all other systems are negative except as noted in the HPI and PMH.   Physical Exam Updated Vital Signs BP 133/87   Pulse 96   Temp 98.3 F (36.8 C) (Oral)   Resp 19   Ht _0  (1.753 m)   Wt 77.6 kg   SpO2 95%   BMI 25.26 kg/m   Physical Exam Vitals and nursing note reviewed.  Constitutional:      General: He is not in acute distress.    Appearance: He is well-developed.  HENT:     Head: Normocephalic and atraumatic.     Nose: Nose normal.     Mouth/Throat:     Pharynx: No oropharyngeal exudate.  Eyes:     Conjunctiva/sclera: Conjunctivae normal.     Pupils: Pupils are equal, round, and reactive to light.  Neck:     Comments: No meningismus. Cardiovascular:     Rate and Rhythm: Normal rate and regular rhythm.     Heart sounds: Normal heart sounds. No murmur heard. Pulmonary:     Effort: Pulmonary effort is normal. No respiratory distress.     Breath sounds: Normal breath sounds.  Chest:     Chest wall: No tenderness.  Abdominal:     Palpations: Abdomen is soft.     Tenderness: There is abdominal tenderness. There is no guarding or rebound.     Comments: Diffuse tenderness, no guarding or rebound   Genitourinary:    Comments: No hemorrhoids or fissures.  There is firm stool on examining finger just past fingertip. Musculoskeletal:        General: No tenderness. Normal range of motion.     Cervical back: Normal range of motion and neck supple.  Skin:    General: Skin is warm.  Neurological:     Mental Status: He is alert and oriented to person, place, and time.     Cranial Nerves: No cranial nerve deficit.     Motor: No abnormal muscle tone.     Coordination: Coordination normal.     Comments: No ataxia on finger to nose bilaterally. No pronator drift. 5/5 strength throughout. CN 2-12 intact.Equal grip strength. Sensation intact.   Psychiatric:        Behavior: Behavior normal.    ED Results / Procedures / Treatments   Labs (all labs ordered are listed, but only abnormal results are displayed) Labs Reviewed  CBC WITH DIFFERENTIAL/PLATELET - Abnormal; Notable for the following components:      Result Value   WBC 15.7 (*)    Neutro Abs 13.9 (*)    Abs Immature Granulocytes 0.09 (*)    All other components within normal limits  COMPREHENSIVE METABOLIC PANEL - Abnormal; Notable for the following components:   Sodium 129 (*)    CO2 21 (*)    Glucose, Bld 181 (*)    AST 53 (*)    All other components within normal limits  RESP PANEL BY RT-PCR (FLU A&B, COVID) ARPGX2  LIPASE, BLOOD  URINALYSIS, ROUTINE W REFLEX MICROSCOPIC  POC OCCULT BLOOD, ED    EKG None  Radiology CT ABDOMEN PELVIS W CONTRAST  Result Date: March 30, 2021 CLINICAL DATA:  Abdominal distension, constipation, anal leakage following bowel prep for colonoscopy. EXAM: CT ABDOMEN AND PELVIS WITH CONTRAST TECHNIQUE: Multidetector CT imaging of the abdomen and pelvis was performed using the standard protocol following bolus administration of intravenous contrast. CONTRAST:  175m OMNIPAQUE IOHEXOL 300  MG/ML  SOLN COMPARISON:  None. FINDINGS: Lower chest: The visualized lung bases are clear. At least moderate right  coronary artery calcification. Global cardiac size within normal limits. Moderate hiatal hernia. Hepatobiliary: Tiny cyst within the left hepatic lobe. Liver otherwise unremarkable. Cholecystectomy has been performed. No intra or extrahepatic biliary ductal dilation. Pancreas: Unremarkable Spleen: Unremarkable Adrenals/Urinary Tract: The adrenal glands are unremarkable. The kidneys are normal in size and position. Multiple tiny hypodensities are seen within the kidneys bilaterally likely representing tiny cortical cysts, but are too small to accurately characterize. No nephro or urolithiasis. There is minimal right hydronephrosis. The bladder is distended. There is marked mass effect upon the base of the bladder secondary to the extensive, expansile stool within the distal rectum. The bladder is displaced anteriorly and superiorly. Stomach/Bowel: There is a large amount of stool within the rectal vault which expands the low rectum with associated rectal wall thickening and mild perirectal inflammatory stranding in keeping with superimposed stercoral proctitis. The large stool ball results in at least partial large bowel obstruction with mild dilation of the more proximal descending and sigmoid colon. Liquid stool is seen within the sigmoid colon and there is multiple fluid-filled loops of nondilated large and small bowel in keeping with the given history of bowel preparation. The stomach, small bowel, and large bowel are otherwise unremarkable. Appendix normal. No free intraperitoneal gas or fluid. Vascular/Lymphatic: Mild aortoiliac atherosclerotic calcification. No aortic aneurysm. No pathologic adenopathy within the abdomen and pelvis. Reproductive: Prostate is unremarkable. Other: Tiny fat containing umbilical hernia. Musculoskeletal: No acute bone abnormality. No lytic or blastic bone lesion. Degenerative changes are seen within the lumbar spine. IMPRESSION: Large volume stool ball within the low rectum  significantly expands the rectum which demonstrates mild inflammatory change in keeping with stercoral proctitis. Resultant partial large bowel obstruction. Marked mass effect upon the base of the bladder likely results in at least mild bladder outlet obstruction with distention of the bladder and minimal right hydronephrosis. Aortic Atherosclerosis (ICD10-I70.0). Electronically Signed   By: Fidela Salisbury M.D.   On: 04-15-2021 01:20    Procedures Procedure Name: Intubation Date/Time: 2021/04/15 7:23 AM Performed by: Ezequiel Essex, MD Pre-anesthesia Checklist: Patient identified, Patient being monitored, Emergency Drugs available, Timeout performed and Suction available Oxygen Delivery Method: Non-rebreather mask Preoxygenation: Pre-oxygenation with 100% oxygen Induction Type: Rapid sequence Ventilation: Mask ventilation without difficulty Laryngoscope Size: Glidescope, Mac and 4 Grade View: Grade II Tube size: 7.5 mm Number of attempts: 1 Airway Equipment and Method: Video-laryngoscopy and Stylet Placement Confirmation: ETT inserted through vocal cords under direct vision, CO2 detector and Breath sounds checked- equal and bilateral Secured at: 25 cm Tube secured with: ETT holder Dental Injury: Teeth and Oropharynx as per pre-operative assessment  Difficulty Due To: Difficulty was anticipated, Difficult Airway- due to large tongue and Difficult Airway- due to anterior larynx Future Recommendations: Recommend- induction with short-acting agent, and alternative techniques readily available    .Critical Care  Date/Time: 15-Apr-2021 7:48 AM Performed by: Ezequiel Essex, MD Authorized by: Ezequiel Essex, MD   Critical care provider statement:    Critical care time (minutes):  90   Critical care was necessary to treat or prevent imminent or life-threatening deterioration of the following conditions:  Dehydration and shock   Critical care was time spent personally by me on the  following activities:  Discussions with consultants, evaluation of patient's response to treatment, examination of patient, ordering and performing treatments and interventions, ordering and review of laboratory studies, ordering and review  of radiographic studies, pulse oximetry, re-evaluation of patient's condition, obtaining history from patient or surrogate and review of old charts   Medications Ordered in ED Medications  sorbitol, milk of mag, mineral oil, glycerin (SMOG) enema (has no administration in time range)  sodium chloride 0.9 % bolus 1,000 mL (has no administration in time range)  ondansetron (ZOFRAN) injection 4 mg (has no administration in time range)    ED Course  I have reviewed the triage vital signs and the nursing notes.  Pertinent labs & imaging results that were available during my care of the patient were reviewed by me and considered in my medical decision making (see chart for details).    MDM Rules/Calculators/A&P                          Patient with chronic constipation due for colonoscopy tomorrow here with rectal pain.  Seen yesterday for urinary retention  Patient has a firm but distended abdomen.  No peritoneal signs.  On exam he does have some hard stool just past examining finger.  Bladder scan with 400 cc of urine.  Patient given an enema.  Labs show mild hyponatremia 129 and leukocytosis of 15.  Patient given smog enema without results.  Also states he took the GoLytely at home without results and used several enemas. CT scan shows large volume stool ball within the rectum causing partial obstruction and inflammatory change as well as mass-effect on the bladder.  Foley catheter to be replaced to see if this improves his ability to defecate. After initial enema, examining finger still unable to remove any stool Patient did have greater than 1 L of urine after Foley catheter placement.  Still unable to have a bowel movement.  Still attempting and  straining on the toilet.  Unable to reach any stool in his rectum.  Multiple calls to gastroenterology Dr. Eula Listen returned. Anticipate the patient will need admission.  At approximately 620 AM, patient became unresponsive on the toilet with agonal respirations, diaphoresis and vomiting.  Patient became unresponsive and needed multiple staff members to move into the bed.  Bag valve mask respirations performed.  Patient unresponsive and mottled and diaphoretic.  Multiple staff members required to move him to the bed.  Plans made for intubation.  Abdomen very distended and firm and hard.  Seems to have progressed throughout ED course.  Patient intubated.  Large amount of clear emesis suctioned. Abdomen is distended.  Additional IV access established, broad-spectrum antibiotics and IV fluids given. Concern for intra-abdominal perforation.  Patient hypotensive in the 70s and sepsis protocol antibiotics and fluids were given.  New labs obtained as well as cultures.  Broad-spectrum antibiotics begun.  Initiated on Levophed.  Repeat x-rays show no obvious free air. Bowel gas pattern stable. Discussed with Dr. Constance Haw of general surgery who will evaluate patient.  She request CT scan once patient is stabilized. Concern for perforation of intra-abdominal viscus.  Dr. Constance Haw will evaluate patient.  His blood pressure has improved to the 110s.  He has received 30 cc/kg of IV fluids as well as broad-spectrum antibiotics.  He remains on Levophed.  Nursing staff unable to place OG.  Still awaiting callback from gastroenterology team Patient's wife updated.  She states patient is a full code at this time  Dr. Constance Haw at bedside attempting to disimpact patient and place OG tube.  Request repeat CT scan.  Discussed with Dr. Jenetta Downer of gastroenterology who will update Dr. Laural Golden.  D/w Dr. Jenetta Downer and Dr. Laural Golden who will see patient.   Dr. Constance Haw unable to place OG tube either.  She was able to  disimpact the patient somewhat.  Repeat CT scan pending.  Call the critical care not returned.  Awaiting callback at shift change. Patient may need transfer to [facility] pending CT results.  Dr. Roderic Palau to assume care.  Final Clinical Impression(s) / ED Diagnoses Final diagnoses:  Fecal impaction [facility])  Urinary retention  Acute respiratory failure with hypoxia [facility])    Rx / DC Orders ED Discharge Orders     None        Montrice Montuori, Annie Main, MD 04/05/21 [phone]

## 2021-03-24 NOTE — Progress Notes (Signed)
[facility]  Family updated during ACLS and once ACLS was stopped. Time of death 11:09AM.  AC notified. Death summary completed.  Algis Greenhouse, MD [facility] [address] [place] [place] [place], [place] [address] [phone] (office)

## 2021-03-24 NOTE — Anesthesia Procedure Notes (Signed)
Date/Time: 15-Apr-2021 10:30 AM Performed by: Julian Reil, CRNA Pre-anesthesia Checklist: Patient being monitored, Suction available, Emergency Drugs available and Patient identified Oxygen Delivery Method: Circle system utilized Tube size: 7.5 mm Placement Confirmation: breath sounds checked- equal and bilateral Secured at: 23 cm Tube secured with: Tape Comments: Remains intubated from ER.

## 2021-03-24 NOTE — ED Notes (Signed)
Pt remains in the restroom at this time. When he attempts to leave the rest room diarrhea/watery stool begins to "leak out", therefore he asks to remain in the restroom for now.

## 2021-03-24 NOTE — ED Notes (Signed)
20 mg of Etomidate and 100 mg of Succinylcholine given for intubation.

## 2021-03-24 NOTE — H&P (Addendum)
[facility] History and Physical  Reason for Referral: Stercoral ulcer, proctitis, concern for perforation  Referring Physician:  Dr. Manus Gunning  Chief Complaint   Abdominal Pain     Christopher Aguilar is a 70 y.o. male.  HPI: Christopher Aguilar is a 70 yo with constipation, HTN, who has been on Linzess but had worsening constipation and was scheduled for a colonoscopy. He had a incomplete colonoscopy at [facility] 10 years ago per his wife and he was reported to have a torturous colon. He did not get a completion colonoscopy to her knowledge or CT. He was scheduled with Dr. Karilyn Cota for today and was doing a prep. He was having some liquid stool but came to the ED with worsening pain and distention  He had a CT at 1AM with impaction and was attempted to get disimpacted and enemas without some minor success per report. He had a vagal response on the toilet around 630 and I was notified. I came in immediately. He was already intubated and sedated. He had a distended abdomen. I did get some stool out of his rectum but given the concern that he could have perforated from this obstruction and stercoral proctitis I had the ED order another CT scan.  CT demonstrated pneumatosis, portal venous gas and possible free air on my interpretation around the liver.   I spoke with his family who reported he was worried he had rectal cancer due to his sister's history.  He was scheduled for his colonoscopy to investigate further.   Past Medical History:  Diagnosis Date   GERD (gastroesophageal reflux disease)    Hypertension    Polio    as a child, has a very mild limp.    Past Surgical History:  Procedure Laterality Date   BIOPSY  03/20/2018   Procedure: BIOPSY;  Surgeon: Malissa Hippo, MD;  [facility];  Service: Endoscopy;;  gastric   CHOLECYSTECTOMY     ESOPHAGOGASTRODUODENOSCOPY N/A 03/20/2018   Procedure: ESOPHAGOGASTRODUODENOSCOPY (EGD);  Surgeon: Malissa Hippo, MD;  [facility];  Service: Endoscopy;  Laterality: N/A;  325   GALLBLADDER SURGERY  03/2015   OPEN REDUCTION INTERNAL FIXATION (ORIF) DISTAL RADIAL FRACTURE Left 08/23/2016   Procedure: OPEN REDUCTION INTERNAL FIXATION (ORIF) LEFT DISTAL RADIUS FRACTURE;  Surgeon: Tarry Kos, MD;  Location: [facility];  Service: Orthopedics;  Laterality: Left;   POLYPECTOMY  03/20/2018   Procedure: POLYPECTOMY;  Surgeon: Malissa Hippo, MD;  [facility];  Service: Endoscopy;;  gastric     Family History  Problem Relation Age of Onset   Diabetes Mother    Hypertension Mother    Cancer Sister    Diabetes Brother    Hypertension Brother     Social History   Tobacco Use   Smoking status: Never   Smokeless tobacco: Never  Vaping Use   Vaping Use: Never used  Substance Use Topics   Alcohol use: No   Drug use: No    Medications: I have reviewed the patient's current medications. Prior to Admission:  Current Facility-Administered Medications  Medication Dose Route Frequency Provider Last Rate Last Admin   ceFEPIme (MAXIPIME) 2 g in sodium chloride 0.9 % 100 mL IVPB  2 g Intravenous Q8H Rancour, Stephen, MD       Chlorhexidine Gluconate Cloth 2 % PADS 6 each  6 each Topical Once Christopher Roers, MD       ertapenem [facility]) 1,000 mg  in sodium chloride 0.9 % 100 mL IVPB  1 g Intravenous On Call to OR Christopher Roers, MD       fentaNYL (SUBLIMAZE) injection 50 mcg  50 mcg Intravenous Q15 min PRN Rancour, Jeannett Senior, MD       fentaNYL (SUBLIMAZE) injection 50 mcg  50 mcg Intravenous Q2H PRN Rancour, Jeannett Senior, MD       HYDROmorphone (DILAUDID) injection 1 mg  1 mg Intravenous Once Rancour, Stephen, MD       norepinephrine (LEVOPHED) 4mg  in premix infusion  0-40 mcg/min Intravenous Titrated Rancour, , MD 37.5 mL/hr at 03-31-21 0650 10 mcg/min at 31-Mar-2021 0650   propofol (DIPRIVAN) 1000 MG/100ML infusion  0-50 mcg/kg/min Intravenous Continuous Rancour, Stephen, MD  2.33 mL/hr at 03-31-21 0708 5 mcg/kg/min at 03-31-21 0708   sodium chloride 0.9 % bolus 1,000 mL  1,000 mL Intravenous Once Rancour, 03/18/21, MD       Current Outpatient Medications  Medication Sig Dispense Refill Last Dose   acetaminophen (TYLENOL) 650 MG CR tablet Take 650 mg by mouth every 8 (eight) hours as needed for pain.   PRN   Alum Hydroxide-Mag Carbonate (GAVISCON PO) Take 1 tablet by mouth in the morning, at noon, and at bedtime.   03/14/2021   Ascorbic Acid (VITAMIN C) 1000 MG tablet Take 1,000 mg by mouth daily.   03/14/2021   baclofen (LIORESAL) 10 MG tablet Take 10 mg by mouth 2 (two) times daily as needed for muscle spasms.   PRN   calcium carbonate (TUMS - DOSED IN MG ELEMENTAL CALCIUM) 500 MG chewable tablet Chew 1 tablet by mouth daily as needed for heartburn or indigestion.   PRN   Cholecalciferol (VITAMIN D) 125 MCG (5000 UT) CAPS Take 5,000 Units by mouth daily.   03/14/2021   Cyanocobalamin (VITAMIN B12) 1000 MCG TBCR Take 1,000 mcg by mouth daily.   03/14/2021   docusate sodium (COLACE) 100 MG capsule Take 100 mg by mouth 2 (two) times daily.   Past Week   Doxylamine Succinate, Sleep, (UNISOM PO) Take 50 mg by mouth at bedtime.   Past Week   famotidine (PEPCID) 10 MG tablet Take 10 mg by mouth at bedtime.   03/14/2021   hydrocortisone (ANUSOL-HC) 2.5 % rectal cream Place 1 application rectally 2 (two) times daily. (Patient taking differently: Place 1 application rectally 2 (two) times daily as needed for hemorrhoids.) 30 g 1 PRN   linaclotide (LINZESS) 72 MCG capsule Take 1 capsule (72 mcg total) by mouth daily before breakfast. 90 capsule 0 03/14/2021   lisinopril (PRINIVIL,ZESTRIL) 20 MG tablet Take 20 mg by mouth in the morning.   03/14/2021   loratadine (CLARITIN) 10 MG tablet Take 10 mg by mouth daily as needed for allergies.   PRN   Melatonin 10 MG TABS Take 10 mg by mouth at bedtime.   03/14/2021   Multiple Vitamin (MULTIVITAMIN WITH MINERALS) TABS tablet Take 1 tablet by  mouth daily.   03/14/2021   pantoprazole (PROTONIX) 40 MG tablet Take 1 tablet (40 mg total) by mouth daily before breakfast. 30 tablet 11 03/14/2021   Probiotic Product (PHILLIPS COLON HEALTH PO) Take 1 capsule by mouth daily.   03/14/2021   simethicone (MYLICON) 125 MG chewable tablet Chew 125 mg by mouth as needed for flatulence.   PRN   hydrocortisone (ANUSOL-HC) 25 MG suppository Place 1 suppository (25 mg total) rectally at bedtime. (Patient not taking: Reported on 03-31-2021) 12 suppository 0 Not Taking   hyoscyamine (  LEVSIN SL) 0.125 MG SL tablet Place 1 tablet (0.125 mg total) under the tongue 3 (three) times daily as needed. 60 tablet 1     Scheduled:  Chlorhexidine Gluconate Cloth  6 each Topical Once    HYDROmorphone (DILAUDID) injection  1 mg Intravenous Once   Continuous:  ceFEPime (MAXIPIME) IV     ertapenem     norepinephrine (LEVOPHED) Adult infusion 10 mcg/min (03/05/2021 0650)   propofol (DIPRIVAN) infusion 5 mcg/kg/min (03/20/2021 0708)   sodium chloride     ZOX:WRUEAVWU (SUBLIMAZE) injection, fentaNYL (SUBLIMAZE) injection Allergies  Allergen Reactions   Lipitor [Atorvastatin]     This statin and all statins patient is intolerant to. Causes increase in Liver Enzymes.   Penicillins Hives    Has patient had a PCN reaction causing immediate rash, facial/tongue/throat swelling, SOB or lightheadedness with hypotension: Yes Has patient had a PCN reaction causing severe rash involving mucus membranes or skin necrosis: No Has patient had a PCN reaction that required hospitalization No Has patient had a PCN reaction occurring within the last 10 years: No If all of the above answers are "NO", then may proceed with Cephalosporin use.      ROS:  Review of systems not obtained due to patient factors.  Blood pressure 92/69, pulse 88, temperature 98.3 F (36.8 C), temperature source Oral, resp. rate (!) 30, height  (1.753 m), weight 77.6 kg, SpO2 95 %. Physical Exam Vitals  reviewed.  Constitutional:      Appearance: He is ill-appearing and toxic-appearing.  HENT:     Head: Normocephalic.  Eyes:     Comments: Eyes closed, pinpoint pupils  Cardiovascular:     Rate and Rhythm: Tachycardia present.  Pulmonary:     Comments: Intubated on vent Abdominal:     General: There is distension.  Genitourinary:    Comments: Rectal with no obvious masses but stool ball noted, blood stool noted  Neurological:     Mental Status: He is disoriented.     Comments: GCS 3T    Results: Results for orders placed or performed during the hospital encounter of 2021-03-21 (from the past 48 hour(s))  Urinalysis, Routine w reflex microscopic Urine, Catheterized     Status: Abnormal   Collection Time: 02/25/2021 12:20 AM  Result Value Ref Range   Color, Urine AMBER (A) YELLOW    Comment: BIOCHEMICALS MAY BE AFFECTED BY COLOR   APPearance CLEAR CLEAR   Specific Gravity, Urine 1.042 (H) 1.005 - 1.030   pH 5.0 5.0 - 8.0   Glucose, UA 50 (A) NEGATIVE mg/dL   Hgb urine dipstick NEGATIVE NEGATIVE   Bilirubin Urine NEGATIVE NEGATIVE   Ketones, ur 20 (A) NEGATIVE mg/dL   Protein, ur NEGATIVE NEGATIVE mg/dL   Nitrite NEGATIVE NEGATIVE   Leukocytes,Ua NEGATIVE NEGATIVE    Comment: Performed at [facility], [address] [place], [place] [address]  Resp Panel by RT-PCR (Flu A&B, Covid) Nasopharyngeal Swab     Status: None   Collection Time: 03/11/2021 12:24 AM   Specimen: Nasopharyngeal Swab; Nasopharyngeal(NP) swabs in vial transport medium  Result Value Ref Range   SARS Coronavirus 2 by RT PCR NEGATIVE NEGATIVE    Comment: (NOTE) SARS-CoV-2 target nucleic acids are NOT DETECTED.  The SARS-CoV-2 RNA is generally detectable in upper respiratory specimens during the acute phase of infection. The lowest concentration of SARS-CoV-2 viral copies this assay can detect is 138 copies/mL. A negative result does not preclude SARS-Cov-2 infection and should not be used as  the sole basis for  treatment or other patient management decisions. A negative result may occur with  improper specimen collection/handling, submission of specimen other than nasopharyngeal swab, presence of viral mutation(s) within the areas targeted by this assay, and inadequate number of viral copies(<138 copies/mL). A negative result must be combined with clinical observations, patient history, and epidemiological information. The expected result is Negative.  Fact Sheet for Patients:  BloggerCourse.com  Fact Sheet for Healthcare Providers:  SeriousBroker.it  This test is no t yet approved or cleared by the [place] FDA and  has been authorized for detection and/or diagnosis of SARS-CoV-2 by FDA under an Emergency Use Authorization (EUA). This EUA will remain  in effect (meaning this test can be used) for the duration of the COVID-19 declaration under Section 564(b)(1) of the Act, 21 U.S.C.section 360bbb-3(b)(1), unless the authorization is terminated  or revoked sooner.       Influenza A by PCR NEGATIVE NEGATIVE   Influenza B by PCR NEGATIVE NEGATIVE    Comment: (NOTE) The Xpert Xpress SARS-CoV-2/FLU/RSV plus assay is intended as an aid in the diagnosis of influenza from Nasopharyngeal swab specimens and should not be used as a sole basis for treatment. Nasal washings and aspirates are unacceptable for Xpert Xpress SARS-CoV-2/FLU/RSV testing.  Fact Sheet for Patients: BloggerCourse.com  Fact Sheet for Healthcare Providers: SeriousBroker.it  This test is not yet approved or cleared by the [place] FDA and has been authorized for detection and/or diagnosis of SARS-CoV-2 by FDA under an Emergency Use Authorization (EUA). This EUA will remain in effect (meaning this test can be used) for the duration of the COVID-19 declaration under Section 564(b)(1) of the Act, 21 U.S.C. section  360bbb-3(b)(1), unless the authorization is terminated or revoked.  Performed at [facility], [address], [place], [place] [address]   CBC with Differential/Platelet     Status: Abnormal   Collection Time: April 12, 2021 12:30 AM  Result Value Ref Range   WBC 15.7 (H) 4.0 - 10.5 K/uL   RBC 4.68 4.22 - 5.81 MIL/uL   Hemoglobin 14.5 13.0 - 17.0 g/dL   HCT 00.9 38.1 - 82.9 %   MCV 88.7 80.0 - 100.0 fL   MCH 31.0 26.0 - 34.0 pg   MCHC 34.9 30.0 - 36.0 g/dL   RDW 93.7 16.9 - 67.8 %   Platelets 319 150 - 400 K/uL   nRBC 0.0 0.0 - 0.2 %   Neutrophils Relative % 88 %   Neutro Abs 13.9 (H) 1.7 - 7.7 K/uL   Lymphocytes Relative 6 %   Lymphs Abs 0.9 0.7 - 4.0 K/uL   Monocytes Relative 5 %   Monocytes Absolute 0.8 0.1 - 1.0 K/uL   Eosinophils Relative 0 %   Eosinophils Absolute 0.0 0.0 - 0.5 K/uL   Basophils Relative 0 %   Basophils Absolute 0.0 0.0 - 0.1 K/uL   Immature Granulocytes 1 %   Abs Immature Granulocytes 0.09 (H) 0.00 - 0.07 K/uL    Comment: Performed at [facility], [address], [place], [place] [address]  Comprehensive metabolic panel     Status: Abnormal   Collection Time: 04-12-21 12:30 AM  Result Value Ref Range   Sodium 129 (L) 135 - 145 mmol/L   Potassium 3.8 3.5 - 5.1 mmol/L   Chloride 100 98 - 111 mmol/L   CO2 21 (L) 22 - 32 mmol/L   Glucose, Bld 181 (H) 70 - 99 mg/dL    Comment: Glucose reference range applies  only to samples taken after fasting for at least 8 hours.   BUN 12 8 - 23 mg/dL   Creatinine, Ser 1.61 0.61 - 1.24 mg/dL   Calcium 9.1 8.9 - 09.6 mg/dL   Total Protein 7.4 6.5 - 8.1 g/dL   Albumin 4.4 3.5 - 5.0 g/dL   AST 53 (H) 15 - 41 U/L   ALT 44 0 - 44 U/L   Alkaline Phosphatase 61 38 - 126 U/L   Total Bilirubin 1.1 0.3 - 1.2 mg/dL   GFR, Estimated >04 >54 mL/min    Comment: (NOTE) Calculated using the CKD-EPI Creatinine Equation (2021)    Anion gap 8 5 - 15    Comment: Performed at [facility], [address] [place], [place] [address]   Lipase, blood     Status: None   Collection Time: 2021-04-03 12:30 AM  Result Value Ref Range   Lipase 19 11 - 51 U/L    Comment: Performed at [facility], [address] [place], [place] [address]  POC occult blood, ED     Status: None   Collection Time: 04-03-2021 12:35 AM  Result Value Ref Range   Fecal Occult Bld NEGATIVE NEGATIVE  Lactic acid, plasma     Status: Abnormal   Collection Time: 2021/04/03  6:49 AM  Result Value Ref Range   Lactic Acid, Venous 10.4 (HH) 0.5 - 1.9 mmol/L    Comment: CRITICAL RESULT CALLED TO, READ BACK BY AND VERIFIED WITH: OAKLEY,B AT 7:40AM ON 04-03-2021 BY [place] Performed at [facility], [address] [place], [place] [address]   Blood culture (routine x 2)     Status: None (Preliminary result)   Collection Time: 2021/04/03  7:15 AM   Specimen: BLOOD  Result Value Ref Range   Specimen Description BLOOD LEFT ANTECUBITAL    Special Requests      BOTTLES DRAWN AEROBIC AND ANAEROBIC Blood Culture adequate volume Performed at [facility], [address] [place], [place] [address]    Culture PENDING    Report Status PENDING   CBC with Differential/Platelet     Status: Abnormal   Collection Time: 2021-04-03  7:15 AM  Result Value Ref Range   WBC 14.1 (H) 4.0 - 10.5 K/uL   RBC 4.54 4.22 - 5.81 MIL/uL   Hemoglobin 14.0 13.0 - 17.0 g/dL   HCT 57.8 46.9 - 62.9 %   MCV 96.3 80.0 - 100.0 fL    Comment: DELTA CHECK NOTED   MCH 30.8 26.0 - 34.0 pg   MCHC 32.0 30.0 - 36.0 g/dL   RDW 52.8 41.3 - 24.4 %   Platelets 376 150 - 400 K/uL   nRBC 0.0 0.0 - 0.2 %   Neutrophils Relative % 91 %   Neutro Abs 12.7 (H) 1.7 - 7.7 K/uL   Lymphocytes Relative 5 %   Lymphs Abs 0.7 0.7 - 4.0 K/uL   Monocytes Relative 3 %   Monocytes Absolute 0.5 0.1 - 1.0 K/uL   Eosinophils Relative 0 %   Eosinophils Absolute 0.0 0.0 - 0.5 K/uL   Basophils Relative 0 %   Basophils Absolute 0.0 0.0 - 0.1 K/uL   Immature Granulocytes 1 %   Abs Immature Granulocytes 0.12 (H) 0.00 -  0.07 K/uL    Comment: Performed at [facility], [address] [place], [place] [address]  Comprehensive metabolic panel     Status: Abnormal   Collection Time: 04/03/21  7:15 AM  Result Value Ref Range   Sodium  136 135 - 145 mmol/L    Comment: DELTA CHECK NOTED   Potassium 3.8 3.5 - 5.1 mmol/L   Chloride 105 98 - 111 mmol/L   CO2 17 (L) 22 - 32 mmol/L   Glucose, Bld 361 (H) 70 - 99 mg/dL    Comment: Glucose reference range applies only to samples taken after fasting for at least 8 hours.   BUN 12 8 - 23 mg/dL   Creatinine, Ser 3.66 (H) 0.61 - 1.24 mg/dL   Calcium 8.1 (L) 8.9 - 10.3 mg/dL   Total Protein 6.2 (L) 6.5 - 8.1 g/dL   Albumin 3.7 3.5 - 5.0 g/dL   AST 440 (H) 15 - 41 U/L   ALT 63 (H) 0 - 44 U/L    Comment: RESULTS CONFIRMED BY MANUAL DILUTION   Alkaline Phosphatase 60 38 - 126 U/L   Total Bilirubin 1.7 (H) 0.3 - 1.2 mg/dL   GFR, Estimated 56 (L) >60 mL/min    Comment: (NOTE) Calculated using the CKD-EPI Creatinine Equation (2021)    Anion gap 14 5 - 15    Comment: Performed at [facility], [address], [place], [place] [address]  Lipase, blood     Status: None   Collection Time: Apr 02, 2021  7:15 AM  Result Value Ref Range   Lipase 31 11 - 51 U/L    Comment: Performed at [facility], [address] [place], [place] [address]  Troponin I (High Sensitivity)     Status: Abnormal   Collection Time: 04/02/2021  7:15 AM  Result Value Ref Range   Troponin I (High Sensitivity) 32 (H) <18 ng/L    Comment: (NOTE) Elevated high sensitivity troponin I (hsTnI) values and significant  changes across serial measurements may suggest ACS but many other  chronic and acute conditions are known to elevate hsTnI results.  Refer to the Links section for chest pain algorithms and additional  guidance. Performed at [facility], [address] [place], [place] [address]   Blood gas, arterial     Status: Abnormal   Collection Time: 04-02-2021  8:59 AM  Result Value Ref Range   FIO2  100.00    pH, Arterial 6.996 (LL) 7.350 - 7.450    Comment: CRITICAL RESULT CALLED TO, READ BACK BY AND VERIFIED WITH: OAKLEY,B AT 9:05AM ON 04-02-2021 BY FESTERMAN,C    pCO2 arterial 36.5 32.0 - 48.0 mmHg   pO2, Arterial 67.2 (L) 83.0 - 108.0 mmHg   Bicarbonate 8.6 (L) 20.0 - 28.0 mmol/L   Acid-base deficit 20.5 (H) 0.0 - 2.0 mmol/L   O2 Saturation 83.8 %   Patient temperature 37.0    Allens test (pass/fail) PASS PASS    Comment: Performed at [facility], [address] [place], [place] [address]   Reviewed CT from 1am and repeat- stercoral ulcer/ proctitis and pneumatosis, possible free air , portal venous gas  CT ABDOMEN PELVIS W CONTRAST  Result Date: Apr 02, 2021 CLINICAL DATA:  Abdominal distension, constipation, anal leakage following bowel prep for colonoscopy. EXAM: CT ABDOMEN AND PELVIS WITH CONTRAST TECHNIQUE: Multidetector CT imaging of the abdomen and pelvis was performed using the standard protocol following bolus administration of intravenous contrast. CONTRAST:  OMNIPAQUE IOHEXOL 300 MG/ML  SOLN COMPARISON:  None. FINDINGS: Lower chest: The visualized lung bases are clear. At least moderate right coronary artery calcification. Global cardiac size within normal limits. Moderate hiatal hernia. Hepatobiliary: Tiny cyst within the left hepatic lobe. Liver otherwise unremarkable. Cholecystectomy has been performed. No intra or  extrahepatic biliary ductal dilation. Pancreas: Unremarkable Spleen: Unremarkable Adrenals/Urinary Tract: The adrenal glands are unremarkable. The kidneys are normal in size and position. Multiple tiny hypodensities are seen within the kidneys bilaterally likely representing tiny cortical cysts, but are too small to accurately characterize. No nephro or urolithiasis. There is minimal right hydronephrosis. The bladder is distended. There is marked mass effect upon the base of the bladder secondary to the extensive, expansile stool within the distal rectum. The  bladder is displaced anteriorly and superiorly. Stomach/Bowel: There is a large amount of stool within the rectal vault which expands the low rectum with associated rectal wall thickening and mild perirectal inflammatory stranding in keeping with superimposed stercoral proctitis. The large stool ball results in at least partial large bowel obstruction with mild dilation of the more proximal descending and sigmoid colon. Liquid stool is seen within the sigmoid colon and there is multiple fluid-filled loops of nondilated large and small bowel in keeping with the given history of bowel preparation. The stomach, small bowel, and large bowel are otherwise unremarkable. Appendix normal. No free intraperitoneal gas or fluid. Vascular/Lymphatic: Mild aortoiliac atherosclerotic calcification. No aortic aneurysm. No pathologic adenopathy within the abdomen and pelvis. Reproductive: Prostate is unremarkable. Other: Tiny fat containing umbilical hernia. Musculoskeletal: No acute bone abnormality. No lytic or blastic bone lesion. Degenerative changes are seen within the lumbar spine. IMPRESSION: Large volume stool ball within the low rectum significantly expands the rectum which demonstrates mild inflammatory change in keeping with stercoral proctitis. Resultant partial large bowel obstruction. Marked mass effect upon the base of the bladder likely results in at least mild bladder outlet obstruction with distention of the bladder and minimal right hydronephrosis. Aortic Atherosclerosis (ICD10-I70.0). Electronically Signed   By: Helyn Numbers M.D.   On: 02/25/2021 01:20   DG Chest Portable 1 View  Result Date: 02/26/2021 CLINICAL DATA:  70 year old male respiratory arrest. EXAM: PORTABLE CHEST 1 VIEW COMPARISON:  CT Abdomen and Pelvis 0059 hours today. FINDINGS: Portable AP supine view at 0650 hours. Intubated, with endotracheal tube tip at the carina or proximal right mainstem bronchus. Elevated diaphragm with low lung  volumes. Mild streaky bilateral lower lung opacity most resembles atelectasis. Normal cardiac size and mediastinal contours. No pneumothorax or pleural effusion is evident on this supine view. Continued abnormal visible bowel-gas pattern. IMPRESSION: 1. Endotracheal tube tip at the carina or proximal right mainstem bronchus. Retract 2 cm for optimal placement. 2. Low lung volumes with mild atelectasis. 3. Continued dilated gas-filled bowel loops in the visible abdomen. Electronically Signed   By: Odessa Fleming M.D.   On: 03/22/2021 07:19   DG Abd Portable 1 View  Result Date: 03/10/2021 CLINICAL DATA:  70 year old male with respiratory arrest. Distal large bowel obstruction, fecal impaction on CT Abdomen and Pelvis 0059 hours today. EXAM: PORTABLE ABDOMEN - 1 VIEW COMPARISON:  CT Abdomen and Pelvis 0059 hours today. FINDINGS: Portable AP supine view at 0648 hours. Continued gas distended bowel in the abdomen and retained stool projecting over the pelvis. The patient is mildly rotated to the right. Gas pattern not significantly changed. No definite pneumoperitoneum on these supine views. No acute osseous abnormality identified. IMPRESSION: Stable gas distended bowel loops and evidence of distal fecal impaction from the CT 0059 hours today. Electronically Signed   By: Odessa Fleming M.D.   On: 03/23/2021 07:21   CT CHEST ABDOMEN PELVIS WO CONTRAST  Result Date: 03/10/2021 CLINICAL DATA:  Respiratory illness, rectal pain, abdominal distension, patient became unresponsive EXAM: CT  CHEST, ABDOMEN AND PELVIS WITHOUT CONTRAST TECHNIQUE: Multidetector CT imaging of the chest, abdomen and pelvis was performed following the standard protocol without IV contrast. COMPARISON:  CT abdomen/pelvis obtained earlier the same day FINDINGS: CT CHEST FINDINGS Lines/tubes: The endotracheal tube terminates in the midthoracic trachea. Cardiovascular: The heart size is stable. There is no pericardial effusion. Mediastinum/Nodes: The imaged  thyroid is unremarkable. There is no mediastinal or axillary lymphadenopathy. There is fluid throughout the esophagus. Lungs/Pleura: The trachea and central airways are patent. There are bibasilar consolidations, worsened since the CT obtained earlier the same day. Patchy opacities in the aerated portions of the lung bases may reflect aspiration given time course. There is no pleural effusion or pneumothorax. Musculoskeletal: There is no acute osseous abnormality. CT ABDOMEN PELVIS FINDINGS Hepatobiliary: There is extensive portal venous gas. The gallbladder is surgically absent. Pancreas: Unremarkable. Spleen: The splenic parenchyma is unremarkable. Vascular gas is noted within the spleen. Adrenals/Urinary Tract: The adrenals are unremarkable. A few small hypodense lesions in the kidneys likely reflects cysts, suboptimally evaluated in the absence of intravenous contrast. There is no new focal lesion. There is no hydronephrosis or hydroureter. Contrast is seen in the collecting system. The bladder is decompressed by Foley catheter. There are no new focal lesions Stomach/Bowel: The stomach is grossly unremarkable. There are multiple loops of dilated large and small bowel with large bowel measuring up to 7.9 cm in the right hemiabdomen and small bowel measuring up to approximately 3.4 cm. There is a large stool ball in the rectum with surrounding inflammatory changes, stable. There is new extensive pneumatosis intestinalis. Vascular/Lymphatic: The abdominal aorta is nonaneurysmal. There is extensive gas throughout the venous vasculature of the abdomen and pelvis. There is no abdominal or pelvic lymphadenopathy. Reproductive: The prostate and seminal vesicles are grossly unremarkable. Other: There is new pneumoperitoneum consistent with bowel perforation. The site of perforation is not definitively identified. Musculoskeletal: There is no acute osseous abnormality. IMPRESSION: 1. 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 2. Findings consistent with stercoral colitis, unlikely to be the sole culprit of the above findings. 3. New bibasilar consolidation likely reflecting atelectasis. Patchy opacities in the aerated portions of the lung bases likely reflect aspiration. Critical Value/emergent results were called by telephone at the time of interpretation on 03/17/2021 at 9:02 am to provider Bethann BerkshireJoseph Zammit, who verbally acknowledged these results. Electronically Signed   By: Lesia HausenPeter  Noone M.D.   On: 03/12/2021 09:24     Assessment & Plan:  Christopher Aguilar is a 70 y.o. male with concerns for bowel perforation, ischemic bowel who is in septic shock and multisystem organ dysfunction, intubated and concern for aspiration pneumonia, difficult to get OG/ NG placed after multiple attempts and was successful with an OG successful. Metabolic acidosis and lactic acid over 10.   CVL, arterial line discussed with family. OR discussed with family and colostomy, colectomy, risk of bleeding, infection, need for colostomy, critical care needs, pneumonia, need [facility], possible death from this septic shock.   All questions were answered to the satisfaction of the patient and son who is an EMT.    Christopher Aguilar 02/26/2021, 10:01 AM

## 2021-03-24 NOTE — ED Notes (Signed)
Dr bridges at bedside. Prepping for central line and arterial line. Consent obtained from family by Dr Henreitta Leber.

## 2021-03-24 NOTE — ED Notes (Signed)
Pt abdomen appears more distended than upon arrival.

## 2021-03-24 NOTE — Progress Notes (Signed)
Pharmacy Antibiotic Note  Christopher Aguilar is a 70 y.o. male admitted on Mar 27, 2021 with sepsis.  Pharmacy has been consulted for Cefepime dosing. Patient with abdominal pain  Plan: Cefepime 2gm IV q8h F/U cxs and clinical progress Monitor V/S, labs  Height: 5\' 9"  (175.3 cm) Weight: 77.6 kg (171 lb 1.2 oz) IBW/kg (Calculated) : 70.7  Temp (24hrs), Avg:98.3 F (36.8 C), Min:98.3 F (36.8 C), Max:98.3 F (36.8 C)  Recent Labs  Lab 03/14/21 1319 03/15/2021 0030 03/09/2021 0649  WBC  --  15.7*  --   CREATININE 0.70 0.63  --   LATICACIDVEN  --   --  10.4*    Estimated Creatinine Clearance: 87.1 mL/min (by C-G formula based on SCr of 0.63 mg/dL).    Allergies  Allergen Reactions   Lipitor [Atorvastatin]     This statin and all statins patient is intolerant to. Causes increase in Liver Enzymes.   Penicillins Hives    Has patient had a PCN reaction causing immediate rash, facial/tongue/throat swelling, SOB or lightheadedness with hypotension: Yes Has patient had a PCN reaction causing severe rash involving mucus membranes or skin necrosis: No Has patient had a PCN reaction that required hospitalization No Has patient had a PCN reaction occurring within the last 10 years: No If all of the above answers are "NO", then may proceed with Cephalosporin use.     Antimicrobials this admission: Cefepime  8/24 >>  Flagyl IV x 1 dose in ED 8/24  Microbiology results: 8/24 BCx: pending  MRSA PCR:   Thank you for allowing pharmacy to be a part of this patient's care.  9/24, BS Elder Cyphers, Loura Back Clinical Pharmacist Pager [phone] 03/18/2021 7:46 AM

## 2021-03-24 NOTE — ED Notes (Signed)
At approx [phone] Nurse into room to give pt pain and nausea medication. Pt remains in restroom stating "I cant come out" nurse then hears pt vomiting and grabs an emesis bag; upon going into the restroom pt is vomiting severally into his gown and will not let go to take emesis bag. Pt is awake however will not answer nurse at this time. This nurse called for assistance upon this time pt is completely unresponsive. Pt is lifted from toilet to chair and taken to the bed. Pt with loose stool coming from his rectum at this time. Pt now is in agonal respirations. Dr. Manus Gunning at bedside and intubation was started.

## 2021-03-24 NOTE — Procedures (Signed)
Procedure Note  04-13-2021    Preoperative Diagnosis: Septic shock, multisystem organ failure, pneumatosis    Postoperative Diagnosis: Same   Procedure(s) Performed: Central Line placement, left internal jugular vein    Surgeon: Leatrice Jewels. Henreitta Leber, MD   Assistants: None   Anesthesia: Sedated with propofol    Complications: None    Indications: Christopher Aguilar is a 70 y.o. with septic shock on pressors with pneumatosis and likely perforation of his bowel. I discussed the risk and benefits of placement of the central line with his wife and son, including but not limited to bleeding, infection, and risk of pneumothorax.They gave consent for the procedure.    Procedure: The patient placed supine. The left chest and neck was prepped and draped in the usual sterile fashion.  Wearing full gown and gloves, I performed the procedure.  One percent lidocaine was used for local anesthesia. I attempted a subclavian stick with no success. An ultrasound was utilized to assess the jugular vein.  The needle with syringe was advanced into the vein with dark venous return, and a wire was placed using the Seldinger technique without difficulty.  Ectopia was not noted.  The skin was knicked and a dilator was placed, and the three lumen catheter was placed over the wire with continued control of the wire.  There was good draw back of blood from all three lumens and each flushed easily with saline.  The catheter was secured in 4 points with 2-0 silk and a biopatch and dressing was placed.     The patient tolerated the procedure well, and the CXR was ordered to confirm position of the central line.   Algis Greenhouse, MD [facility] [address] [place], [place] [address] [phone] (office)

## 2021-03-24 NOTE — Progress Notes (Signed)
[facility]  Patient's family aware of critical state of patient and allowed to see him before the OR. He could code on the table and not survive the operation. They are aware. They want everything done to attempt to save his life.  Algis Greenhouse, MD [facility] [address] [place], [place] [address] [phone] (office)

## 2021-03-24 NOTE — Anesthesia Postprocedure Evaluation (Signed)
Anesthesia Post Note  Patient: Christopher Aguilar  Procedure(s) Performed: EXPLORATORY LAPAROTOMY (Abdomen)  Patient location during evaluation: Other (OR) Anesthesia Type: General Level of consciousness: comatose Anesthetic complications: no Comments: Patient continued to have intraop severe hypotension, hypoxic and progressed to cardiac arrest, ACLS protocols followed. Patient pronounced at 1109.   No notable events documented.   Last Vitals:  Vitals:   04/08/2021 0830 April 08, 2021 0845  BP: 106/76 92/69  Pulse: 87 88  Resp: (!) 24 (!) 30  Temp:    SpO2: 99% 95%    Last Pain:  Vitals:   08-Apr-2021 0415  TempSrc:   PainSc: 6                  Ladina Shutters C Lillieanna Tuohy

## 2021-03-24 NOTE — ED Notes (Signed)
Blood cultures collected at 0710

## 2021-03-24 NOTE — Death Summary Note (Signed)
DEATH SUMMARY   Patient Details  Name: Christopher Aguilar Woolman MRN: [id number] DOB: 10/13/1950  Admission/Discharge Information   Admit Date:  03/14/2021  Date of Death: Date of Death: 2020/07/28  Time of Death: 11:09 AM  Length of Stay: 0  Referring Physician: Suzan Slickucker, Alethea Y, MD   Reason(s) for Hospitalization  Fecal impaction with pneumatosis and concern for ischemic bowel versus perforation   Diagnoses  Preliminary cause of death:  Secondary Diagnoses (including complications and co-morbidities):  Principal Problem:   Ischemic bowel disease (HCC) Active Problems:   Sepsis due to undetermined organism (HCC)   Perforated bowel (HCC)   Acute respiratory failure with hypoxia (HCC)   Aspiration pneumonitis (HCC)   Fecal impaction (HCC)   Lactic acidosis   Brief Hospital Course (including significant findings, care, treatment, and services provided and events leading to death)  Christopher Aguilar Ciavarella is a 70 y.o. year old male who came into the hospital with rectal impaction and plans for a colonoscopy today.  He unfortunately had worsening distention and pain with his prep and was brought to the hospital. Initial CT demonstrated impaction and attempts were made by the ED to disimpact him and give enemas.  He then had a vagal response and became unresponsive on the toilet, required intubation and subsequent CT demonstrated pneumatosis, portal gas and concern for ischemic versus perforation. I spoke with the family and they opted for operation to explore. He also had a central line placed as he had septic shock with multisystem organ failure, lactic acidosis with pH 6.99.  During the operation he was found to have ischemic bowel, viable small bowel but distended and ischemic colon with serosal and muscular tears. Attempted total colectomy was aborted after they patient coded. ACLS was performed with CPR  for over 10 minutes, 3X epinephrine and 2 X shocks. He could not be recaptured. I notified the  family and stopped resuscitation.    He was pronounced dead at 11:09AM.    Pertinent Labs and Studies  Significant Diagnostic Studies CT ABDOMEN PELVIS W CONTRAST  Result Date: 2021-01-23 CLINICAL DATA:  Abdominal distension, constipation, anal leakage following bowel prep for colonoscopy. EXAM: CT ABDOMEN AND PELVIS WITH CONTRAST TECHNIQUE: Multidetector CT imaging of the abdomen and pelvis was performed using the standard protocol following bolus administration of intravenous contrast. CONTRAST:  100mL OMNIPAQUE IOHEXOL 300 MG/ML  SOLN COMPARISON:  None. FINDINGS: Lower chest: The visualized lung bases are clear. At least moderate right coronary artery calcification. Global cardiac size within normal limits. Moderate hiatal hernia. Hepatobiliary: Tiny cyst within the left hepatic lobe. Liver otherwise unremarkable. Cholecystectomy has been performed. No intra or extrahepatic biliary ductal dilation. Pancreas: Unremarkable Spleen: Unremarkable Adrenals/Urinary Tract: The adrenal glands are unremarkable. The kidneys are normal in size and position. Multiple tiny hypodensities are seen within the kidneys bilaterally likely representing tiny cortical cysts, but are too small to accurately characterize. No nephro or urolithiasis. There is minimal right hydronephrosis. The bladder is distended. There is marked mass effect upon the base of the bladder secondary to the extensive, expansile stool within the distal rectum. The bladder is displaced anteriorly and superiorly. Stomach/Bowel: There is a large amount of stool within the rectal vault which expands the low rectum with associated rectal wall thickening and mild perirectal inflammatory stranding in keeping with superimposed stercoral proctitis. The large stool ball results in at least partial large bowel obstruction with mild dilation of the more proximal descending and sigmoid colon. Liquid stool is seen within the  sigmoid colon and there is multiple  fluid-filled loops of nondilated large and small bowel in keeping with the given history of bowel preparation. The stomach, small bowel, and large bowel are otherwise unremarkable. Appendix normal. No free intraperitoneal gas or fluid. Vascular/Lymphatic: Mild aortoiliac atherosclerotic calcification. No aortic aneurysm. No pathologic adenopathy within the abdomen and pelvis. Reproductive: Prostate is unremarkable. Other: Tiny fat containing umbilical hernia. Musculoskeletal: No acute bone abnormality. No lytic or blastic bone lesion. Degenerative changes are seen within the lumbar spine. IMPRESSION: Large volume stool ball within the low rectum significantly expands the rectum which demonstrates mild inflammatory change in keeping with stercoral proctitis. Resultant partial large bowel obstruction. Marked mass effect upon the base of the bladder likely results in at least mild bladder outlet obstruction with distention of the bladder and minimal right hydronephrosis. Aortic Atherosclerosis (ICD10-I70.0). Electronically Signed   By: Helyn Numbers M.D.   On: April 05, 2021 01:20   DG Chest Port 1 View  Result Date: 04-05-2021 CLINICAL DATA:  Central line placement. EXAM: PORTABLE CHEST 1 VIEW COMPARISON:  Chest radiograph 04-05-2021 at 6:50 a.m. CT chest, abdomen, and pelvis 2021-04-05. FINDINGS: The endotracheal tube has been retracted and now terminates approximately 1.5 cm above the carina. A left jugular catheter has been placed and terminates over the lower SVC. An enteric tube has been placed with tip projecting over the gastric body and side hole projecting over the distal esophagus. Lung volumes are low with patchy airspace opacities bilaterally, greatest in the lung bases. Airspace opacities have progressed from today's earlier chest radiograph but are similar in extent to the interval CT. No large pleural effusion or pneumothorax is identified. The included portion of the upper abdomen demonstrates  pneumoperitoneum, portal venous gas, and gaseous bowel distension as shown on the recent CT. IMPRESSION: 1. Support devices including new right jugular catheter as above. 2. Enteric tube terminates in the stomach with side hole in the distal esophagus. Consider advancement. 3. Patchy bilateral airspace opacities which may reflect atelectasis with superimposed aspiration or pneumonia. Electronically Signed   By: Sebastian Ache M.D.   On: 2021-04-05 10:58   DG Chest Portable 1 View  Result Date: 2021-04-05 CLINICAL DATA:  70 year old male respiratory arrest. EXAM: PORTABLE CHEST 1 VIEW COMPARISON:  CT Abdomen and Pelvis 0059 hours today. FINDINGS: Portable AP supine view at 0650 hours. Intubated, with endotracheal tube tip at the carina or proximal right mainstem bronchus. Elevated diaphragm with low lung volumes. Mild streaky bilateral lower lung opacity most resembles atelectasis. Normal cardiac size and mediastinal contours. No pneumothorax or pleural effusion is evident on this supine view. Continued abnormal visible bowel-gas pattern. IMPRESSION: 1. Endotracheal tube tip at the carina or proximal right mainstem bronchus. Retract 2 cm for optimal placement. 2. Low lung volumes with mild atelectasis. 3. Continued dilated gas-filled bowel loops in the visible abdomen. Electronically Signed   By: Odessa Fleming M.D.   On: 2021/04/05 07:19   DG Abd Portable 1 View  Result Date: 04-05-2021 CLINICAL DATA:  70 year old male with respiratory arrest. Distal large bowel obstruction, fecal impaction on CT Abdomen and Pelvis 0059 hours today. EXAM: PORTABLE ABDOMEN - 1 VIEW COMPARISON:  CT Abdomen and Pelvis 0059 hours today. FINDINGS: Portable AP supine view at 0648 hours. Continued gas distended bowel in the abdomen and retained stool projecting over the pelvis. The patient is mildly rotated to the right. Gas pattern not significantly changed. No definite pneumoperitoneum on these supine views. No acute osseous abnormality  identified. IMPRESSION:  Stable gas distended bowel loops and evidence of distal fecal impaction from the CT 0059 hours today. Electronically Signed   By: Odessa Fleming M.D.   On: 03/21/2021 07:21   CT CHEST ABDOMEN PELVIS WO CONTRAST  Result Date: 03/18/2021 CLINICAL DATA:  Respiratory illness, rectal pain, abdominal distension, patient became unresponsive EXAM: CT CHEST, ABDOMEN AND PELVIS WITHOUT CONTRAST TECHNIQUE: Multidetector CT imaging of the chest, abdomen and pelvis was performed following the standard protocol without IV contrast. COMPARISON:  CT abdomen/pelvis obtained earlier the same day FINDINGS: CT CHEST FINDINGS Lines/tubes: The endotracheal tube terminates in the midthoracic trachea. Cardiovascular: The heart size is stable. There is no pericardial effusion. Mediastinum/Nodes: The imaged thyroid is unremarkable. There is no mediastinal or axillary lymphadenopathy. There is fluid throughout the esophagus. Lungs/Pleura: The trachea and central airways are patent. There are bibasilar consolidations, worsened since the CT obtained earlier the same day. Patchy opacities in the aerated portions of the lung bases may reflect aspiration given time course. There is no pleural effusion or pneumothorax. Musculoskeletal: There is no acute osseous abnormality. CT ABDOMEN PELVIS FINDINGS Hepatobiliary: There is extensive portal venous gas. The gallbladder is surgically absent. Pancreas: Unremarkable. Spleen: The splenic parenchyma is unremarkable. Vascular gas is noted within the spleen. Adrenals/Urinary Tract: The adrenals are unremarkable. A few small hypodense lesions in the kidneys likely reflects cysts, suboptimally evaluated in the absence of intravenous contrast. There is no new focal lesion. There is no hydronephrosis or hydroureter. Contrast is seen in the collecting system. The bladder is decompressed by Foley catheter. There are no new focal lesions Stomach/Bowel: The stomach is grossly unremarkable.  There are multiple loops of dilated large and small bowel with large bowel measuring up to 7.9 cm in the right hemiabdomen and small bowel measuring up to approximately 3.4 cm. There is a large stool ball in the rectum with surrounding inflammatory changes, stable. There is new extensive pneumatosis intestinalis. Vascular/Lymphatic: The abdominal aorta is nonaneurysmal. There is extensive gas throughout the venous vasculature of the abdomen and pelvis. There is no abdominal or pelvic lymphadenopathy. Reproductive: The prostate and seminal vesicles are grossly unremarkable. Other: There is new pneumoperitoneum consistent with bowel perforation. The site of perforation is not definitively identified. Musculoskeletal: There is no acute osseous abnormality. IMPRESSION: 1. 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 2. Findings consistent with stercoral colitis, unlikely to be the sole culprit of the above findings. 3. New bibasilar consolidation likely reflecting atelectasis. Patchy opacities in the aerated portions of the lung bases likely reflect aspiration. Critical Value/emergent results were called by telephone at the time of interpretation on 02/21/2021 at 9:02 am to provider Bethann Berkshire, who verbally acknowledged these results. Electronically Signed   By: Lesia Hausen M.D.   On: 03/13/2021 09:24    Microbiology Recent Results (from the past 240 hour(s))  Resp Panel by RT-PCR (Flu A&B, Covid) Nasopharyngeal Swab     Status: None   Collection Time: 03/05/2021 12:24 AM   Specimen: Nasopharyngeal Swab; Nasopharyngeal(NP) swabs in vial transport medium  Result Value Ref Range Status   SARS Coronavirus 2 by RT PCR NEGATIVE NEGATIVE Final    Comment: (NOTE) SARS-CoV-2 target nucleic acids are NOT DETECTED.  The SARS-CoV-2 RNA is generally detectable in upper  respiratory specimens during the acute phase of infection. The lowest concentration of SARS-CoV-2 viral copies this assay can detect is 138 copies/mL. A negative  result does not preclude SARS-Cov-2 infection and should not be used as the sole basis for treatment or other patient management decisions. A negative result may occur with  improper specimen collection/handling, submission of specimen other than nasopharyngeal swab, presence of viral mutation(s) within the areas targeted by this assay, and inadequate number of viral copies(<138 copies/mL). A negative result must be combined with clinical observations, patient history, and epidemiological information. The expected result is Negative.  Fact Sheet for Patients:  BloggerCourse.com  Fact Sheet for Healthcare Providers:  SeriousBroker.it  This test is no t yet approved or cleared by the [place] FDA and  has been authorized for detection and/or diagnosis of SARS-CoV-2 by FDA under an Emergency Use Authorization (EUA). This EUA will remain  in effect (meaning this test can be used) for the duration of the COVID-19 declaration under Section 564(b)(1) of the Act, 21 U.S.C.section 360bbb-3(b)(1), unless the authorization is terminated  or revoked sooner.       Influenza A by PCR NEGATIVE NEGATIVE Final   Influenza B by PCR NEGATIVE NEGATIVE Final    Comment: (NOTE) The Xpert Xpress SARS-CoV-2/FLU/RSV plus assay is intended as an aid in the diagnosis of influenza from Nasopharyngeal swab specimens and should not be used as a sole basis for treatment. Nasal washings and aspirates are unacceptable for Xpert Xpress SARS-CoV-2/FLU/RSV testing.  Fact Sheet for Patients: BloggerCourse.com  Fact Sheet for Healthcare Providers: SeriousBroker.it  This test is not yet approved or cleared by the [place] FDA and has been  authorized for detection and/or diagnosis of SARS-CoV-2 by FDA under an Emergency Use Authorization (EUA). This EUA will remain in effect (meaning this test can be used) for the duration of the COVID-19 declaration under Section 564(b)(1) of the Act, 21 U.S.C. section 360bbb-3(b)(1), unless the authorization is terminated or revoked.  Performed at [facility], [address] [place], [place] [address]   Blood culture (routine x 2)     Status: None (Preliminary result)   Collection Time: Apr 05, 2021  7:15 AM   Specimen: BLOOD  Result Value Ref Range Status   Specimen Description BLOOD LEFT ANTECUBITAL  Final   Special Requests   Final    BOTTLES DRAWN AEROBIC AND ANAEROBIC Blood Culture adequate volume Performed at [facility], [address] [place], [place] [address]    Culture PENDING  Incomplete   Report Status PENDING  Incomplete    Lab Basic Metabolic Panel: Recent Labs  Lab 03/14/21 1319 04/05/2021 0030 04/05/2021 0715  NA 132* 129* 136  K 4.2 3.8 3.8  CL 101 100 105  CO2 25 21* 17*  GLUCOSE 95 181* 361*  BUN 13 12 12   CREATININE 0.70 0.63 1.36*  CALCIUM 9.2 9.1 8.1*   Liver Function Tests: Recent Labs  Lab 2021/04/05 0030 Apr 05, 2021 0715  AST 53* 104*  ALT 44 63*  ALKPHOS 61 60  BILITOT 1.1 1.7*  PROT 7.4 6.2*  ALBUMIN 4.4 3.7   Recent Labs  Lab 2021-04-05 0030 04-05-2021 0715  LIPASE 19 31   No results for input(s): AMMONIA in the last 168 hours. CBC: Recent Labs  Lab 04/05/2021 0030 04-05-21 0715  WBC 15.7* 14.1*  NEUTROABS 13.9* 12.7*  HGB 14.5 14.0  HCT 41.5 43.7  MCV 88.7 96.3  PLT 319 376   Cardiac Enzymes: No results for input(s): CKTOTAL, CKMB, CKMBINDEX, TROPONINI in the last 168 hours. Sepsis Labs: Recent Labs  Lab 04-05-2021 0030 04/05/21 0649 04/05/21 0715  WBC 15.7*  --  14.1*  LATICACIDVEN  --  10.4*  --     Procedures/Operations  Exploratory laparotomy, bowel decompression and closure of skin - findings of ischemic bowel with  likely perforation of the rectum    Lucretia Roers 2021/04/15, 11:49 AM

## 2021-03-24 NOTE — ED Provider Notes (Signed)
CRITICAL CARE Performed by: Bethann Berkshire Total critical care time: 30 minutes Critical care time was exclusive of separately billable procedures and treating other patients. Critical care was necessary to treat or prevent imminent or life-threatening deterioration. Critical care was time spent personally by me on the following activities: development of treatment plan with patient and/or surrogate as well as nursing, discussions with consultants, evaluation of patient's response to treatment, examination of patient, obtaining history from patient or surrogate, ordering and performing treatments and interventions, ordering and review of laboratory studies, ordering and review of radiographic studies, pulse oximetry and re-evaluation of patient's condition. Patient with perforated bowel..  Patient became unresponsive and was intubated by Dr. Manus Gunning  and started on pressor agents for hypotension.  He was seen by Dr. Henreitta Leber and he was septic with the perforated bowel.  She attempted to resuscitate him and then took him to the OR   Bethann Berkshire, MD 03-29-2021 1045

## 2021-03-24 NOTE — ED Notes (Signed)
Dr. Henreitta Leber at bedside to attempt OG and NG, unsuccessful. Nurse also attempted both at bedside prior to this attempt without success. Dr. Henreitta Leber to attempt manual disimpaction.

## 2021-03-24 NOTE — Addendum Note (Signed)
Addendum  created 03-31-21 1327 by Julian Reil, CRNA   Intraprocedure Event edited

## 2021-03-24 NOTE — Progress Notes (Signed)
After ETT tube was placed, CXR was done.  ED Physician wanted tube pulled out about 2 cm.  Tube is now at 23 at the lip.  Good BS can be heard bilateral.

## 2021-03-24 NOTE — Transfer of Care (Signed)
Immediate Anesthesia Transfer of Care Note  Patient: Christopher Aguilar  Procedure(s) Performed: EXPLORATORY LAPAROTOMY (Abdomen)  Patient Location: Patient expired in OR 3  Anesthesia Type:General  Level of Consciousness: Patient remains intubated per anesthesia plan  Airway & Oxygen Therapy: Patient expired in OR 3  Post-op Assessment: Patient expired in OR 3.  Post vital signs: Reviewed  Last Vitals:  Vitals Value Taken Time  BP    Temp    Pulse    Resp    SpO2      Last Pain:  Vitals:   03/01/2021 0415  TempSrc:   PainSc: 6          Complications: No notable events documented.

## 2021-03-24 NOTE — ED Notes (Signed)
Second amp of sodium bicarbonate given IV push per verbal orders with readback from Dr. Onalee Hua Tat MD for correction of metabolic acidosis.

## 2021-03-24 NOTE — ED Notes (Signed)
Dr. Manus Gunning notified of increased abdominal distention.

## 2021-03-24 NOTE — ED Notes (Signed)
Slight decrease in lower abdominal distention however upper abdominal distention remains the same. Dr. Manus Gunning notified of urinary output and abdominal appearance.

## 2021-03-24 NOTE — ED Notes (Signed)
Date and time results received: 02/27/2021 0906   Test: pH Critical Value: 6.996 Name of Provider Notified: Dr. Henreitta Leber  Orders Received? Or Actions Taken? See ordes

## 2021-03-24 NOTE — Op Note (Signed)
[facility] Operative Note  02/27/2021  Preoperative Diagnosis: Pneumatosis, portal venous gas, possible free air    Postoperative Diagnosis: Ischemic bowel, likely rectal perforation    Procedure(s) Performed: Exploratory laparotomy, attempted total colectomy aborted after patient coded and ACLS attempted, died intraoperatively    Surgeon: Leatrice Jewels. Henreitta Leber, MD   Assistants: Franky Macho, MD    Anesthesia: General endotracheal   Anesthesiologist: Dr. Judeth Porch   Specimens: None    Estimated Blood Loss: Minimal   Blood Replacement: None    Complications: Death    Wound Class:Dirty/ Infected    Operative Indications: Mr. Yoak is a 70 yo with septic shock, multiple system organ dysfunction, severe lactic acidosis with concern for pneumatosis and portal gas, possible perforation and ischemic bowel who was taken back to the OR for emergent exploration. He had come in with impaction and then had vagal event and became unresponsive and required intubation and was hypotensive.  A repeat CT demonstrated the pneumatosis and protal gas. I spoke to his wife and son about surgery and risk of bleeding, infection, needing ostomy, needing additional surgeries, not surviving the operative room, needing critical care and possible transfer. Also discussed his extensive aspiration pneumonia from his distention and intubation. They wanted to proceed.   Findings: Ischemic bowel, small bowel with some improvement on opening abdomen, colon with ischemic changes throughout, possible rectal perforation    Procedure: The patient was taken to the operating room and placed supine. On moving him he did have some ventricular tachycardia but did not lose pulse. General endotracheal anesthesia continued as he had previously been intubated and OG placed. A foley had previously been placed. Intravenous antibiotics were administered previously administered.  The abdomen was prepared and draped in the  usual sterile fashion.   A large midline incision was made and carried down to the fascia. The abdomen was entered and murky fluid was encountered. The small bowel looked as it had been ischemic but was improving and was viable. The entire colon was dilated and had ischemic changes with serosal, partial muscular tears from the distention. The right colon was mobilized and a 75 mm linear stapler was used to come across the terminal ileum with plans for total colectomy and leaving the patient in discontinuity for damage control and second look laparotomy if he survived.  Unfortunately, the patient lost pulses. ACLS protocol was started and over 10 minutes of CPR was performed, 2 shocks and 3 rounds of epinephrine. I went and discussed the findings and events with the family and ultimately stopped CPR and resuscitation given the futile nature. The family was in agreement and understanding.   There was a hardened enlarged stool ball in the rectosigmoid junction and signs of likely perforation with murky fluid in the pelvis.  This was not explored more as the patient had been pronounced dead. The bowel was extensively dilated and was opened and decompressed to allow for closure with a Novafil whip stitch of the skin to close the intestines into he abdominal cavity.   Dr. Lovell Sheehan was assisting throughout the procedure and was present for the critical portions of the case.   The patient went to the morgue after leaving the operating room.    Algis Greenhouse, MD [facility] [address] [place], [place] [address] [phone] (office)

## 2021-03-24 DEATH — deceased

## 2021-04-05 ENCOUNTER — Ambulatory Visit (INDEPENDENT_AMBULATORY_CARE_PROVIDER_SITE_OTHER): Payer: Medicare PPO | Admitting: Internal Medicine
